# Patient Record
Sex: Female | Born: 1985
Health system: Southern US, Community
[De-identification: ages and names within clinical notes are randomized; demographics above are authoritative.]

## PROBLEM LIST (undated history)

## (undated) DIAGNOSIS — A6920 Lyme disease, unspecified: Secondary | ICD-10-CM

## (undated) DIAGNOSIS — D649 Anemia, unspecified: Secondary | ICD-10-CM

## (undated) DIAGNOSIS — M329 Systemic lupus erythematosus, unspecified: Secondary | ICD-10-CM

## (undated) DIAGNOSIS — M069 Rheumatoid arthritis, unspecified: Secondary | ICD-10-CM

## (undated) HISTORY — PX: TUBAL LIGATION: SHX77

---

## 2011-10-20 ENCOUNTER — Encounter (HOSPITAL_BASED_OUTPATIENT_CLINIC_OR_DEPARTMENT_OTHER): Payer: Self-pay | Admitting: *Deleted

## 2011-10-20 ENCOUNTER — Emergency Department (INDEPENDENT_AMBULATORY_CARE_PROVIDER_SITE_OTHER): Payer: Self-pay

## 2011-10-20 ENCOUNTER — Emergency Department (HOSPITAL_BASED_OUTPATIENT_CLINIC_OR_DEPARTMENT_OTHER)
Admission: EM | Admit: 2011-10-20 | Discharge: 2011-10-20 | Disposition: A | Discharge status: Home / Self Care | Payer: Self-pay | Attending: Emergency Medicine | Admitting: Emergency Medicine

## 2011-10-20 ENCOUNTER — Other Ambulatory Visit: Payer: Self-pay

## 2011-10-20 DIAGNOSIS — F17200 Nicotine dependence, unspecified, uncomplicated: Secondary | ICD-10-CM | POA: Insufficient documentation

## 2011-10-20 DIAGNOSIS — R799 Abnormal finding of blood chemistry, unspecified: Secondary | ICD-10-CM

## 2011-10-20 DIAGNOSIS — R079 Chest pain, unspecified: Secondary | ICD-10-CM

## 2011-10-20 DIAGNOSIS — R071 Chest pain on breathing: Secondary | ICD-10-CM

## 2011-10-20 LAB — D-DIMER, QUANTITATIVE: D-Dimer, Quant: 0.55 ug/mL-FEU — ABNORMAL HIGH (ref 0.00–0.48)

## 2011-10-20 MED ORDER — IOHEXOL 350 MG/ML SOLN
100.0000 mL | Freq: Once | INTRAVENOUS | Status: AC | PRN
  Administered 2011-10-20: 100 mL via INTRAVENOUS

## 2011-10-20 MED ORDER — SODIUM CHLORIDE 0.9 % IV BOLUS (SEPSIS)
1000.0000 mL | Freq: Once | INTRAVENOUS | Status: AC
  Administered 2011-10-20: 1000 mL via INTRAVENOUS

## 2011-10-20 MED ORDER — DIAZEPAM 5 MG/ML IJ SOLN
5.0000 mg | Freq: Once | INTRAMUSCULAR | Status: AC
  Administered 2011-10-20: 5 mg via INTRAVENOUS
  Filled 2011-10-20: qty 2

## 2011-10-20 NOTE — ED Provider Notes (Signed)
History    26 year old female with chest pain.gradual onset about a week and half ago. Pain is substernal. Worse with deep inspiration. Describes the pain as sharp. Was seen at outside facility couple days ago and diagnosed with reflux. Prescribed a medication for this which she has been taking without relief. Not sure of what med. Denies trauma. No SOB. No fever or chills. No hx of similar pain. Is a smoker. Denies hx of blood clot. No unusual leg pain or swelling.   CSN: [id number]  Arrival date & time 10/20/11  1427   First MD Initiated Contact with Patient 10/20/11 1452      Chief Complaint  Patient presents with  . Chest Pain    (Consider location/radiation/quality/duration/timing/severity/associated sxs/prior treatment) HPI  History reviewed. No pertinent past medical history.  Past Surgical History  Procedure Date  . Cesarean section   . Tubal ligation     History reviewed. No pertinent family history.  History  Substance Use Topics  . Smoking status: Current Everyday Smoker  . Smokeless tobacco: Not on file  . Alcohol Use: Yes    OB History    Grav Para Term Preterm Abortions TAB SAB Ect Mult Living                  Review of Systems   Review of symptoms negative unless otherwise noted in HPI.   Allergies  Penicillins  Home Medications  No current outpatient prescriptions on file.  BP 103/54  Pulse 92  Temp(Src) 99.1 F (37.3 C) (Oral)  Resp 20  Ht 5\' 3"  (1.6 m)  Wt 120 lb (54.432 kg)  BMI 21.26 kg/m2  SpO2 100%  LMP 10/20/2011  Physical Exam  Nursing note and vitals reviewed. Constitutional: She appears well-developed and well-nourished. No distress.       Sitting up in bed. NAD.  HENT:  Head: Normocephalic and atraumatic.  Eyes: Conjunctivae are normal. Pupils are equal, round, and reactive to light. Right eye exhibits no discharge. Left eye exhibits no discharge.  Neck: Neck supple.  Cardiovascular: Normal rate, regular rhythm and  normal heart sounds.  Exam reveals no gallop and no friction rub.   No murmur heard. Pulmonary/Chest: Effort normal and breath sounds normal. No respiratory distress. She exhibits no tenderness.       CP is not reproducible.  Abdominal: Soft. She exhibits no distension. There is no tenderness.  Musculoskeletal: She exhibits no edema and no tenderness.       No calf tenderness. Negative homan's.  Neurological: She is alert.  Skin: Skin is warm and dry. She is not diaphoretic.  Psychiatric: She has a normal mood and affect. Her behavior is normal. Thought content normal.    ED Course  Procedures (including critical care time)   Labs Reviewed  D-DIMER, QUANTITATIVE   Dg Chest 2 View  10/20/2011  *RADIOLOGY REPORT*  Clinical Data: Chest pain  CHEST - 2 VIEW  Comparison: None.  Findings: Cardiomediastinal silhouette is within normal limits. The lungs are clear. No pleural effusion.  No pneumothorax.  No acute osseous abnormality.  IMPRESSION: Normal chest.  Original Report Authenticated By: Harrel Lemon, M.D.   Ct Angio Chest W/cm &/or Wo Cm  10/20/2011  *RADIOLOGY REPORT*  Clinical Data: Pleuritic chest pain.  Elevated D-dimer.  CT ANGIOGRAPHY CHEST  Technique:  Multidetector CT imaging of the chest using the standard protocol during bolus administration of intravenous contrast. Multiplanar reconstructed images including MIPs were obtained and reviewed to evaluate the  vascular anatomy.  Contrast: OMNIPAQUE IOHEXOL 350 MG/ML IV SOLN  Comparison: None.  Findings: Satisfactory opacification of the pulmonary arteries noted, and there is no evidence of pulmonary emboli.  No evidence of thoracic aortic aneurysm or dissection.  No evidence of mediastinal hematoma or mass.  No lymphadenopathy identified within the thorax.  There is no evidence of pleural or pericardial effusion.  Both lungs are clear.  No evidence of pulmonary infiltrate or mass.  No central endobronchial lesion identified.   IMPRESSION: Negative.  No evidence of pulmonary embolism or other active disease.  Original Report Authenticated By: Danae Orleans, M.D.    EKG:  Rhythm: normal sinus Rate: 91 Axis: normal Intervals: normal ST segments:minimal non-diagnostic ST elevation in v2. No comparison.   1. Chest pain       MDM  25yF with CP. Etiology unclear but low suspicion for emergent/urgent etiology. Consider infectious but doubt. CXR and CT with no focal infiltrate. Afebrile and no respiratory distress on exam. Felt clinically low probability for PE but dimer elevated. Subsequent CT neg for PE or other acute pathology. EKG with nonspecific changes. Consider pericarditis but doubt. Possibly GERD but not classic symptoms. Feel reasonable to continue prescribed meds for this though. Possibly musculoskeletal but not reproducible. Feel that pt is safe for DC at this time. Discussed smoking cessation. Outpt fu.        Raeford Razor, MD 10/20/11 [phone]

## 2011-10-20 NOTE — ED Notes (Signed)
Pt states she has been having chest tightness for 1-1/2 weeks. Increased with walking and deep inspiration. Went to [facility] last week and was dx'd with acid reflux and given med for same. Pt feels there is something more going on and wants further testing.

## 2012-01-10 ENCOUNTER — Emergency Department (HOSPITAL_BASED_OUTPATIENT_CLINIC_OR_DEPARTMENT_OTHER)
Admission: EM | Admit: 2012-01-10 | Discharge: 2012-01-10 | Disposition: A | Discharge status: Home / Self Care | Payer: Self-pay | Attending: Emergency Medicine | Admitting: Emergency Medicine

## 2012-01-10 ENCOUNTER — Encounter (HOSPITAL_BASED_OUTPATIENT_CLINIC_OR_DEPARTMENT_OTHER): Payer: Self-pay | Admitting: Emergency Medicine

## 2012-01-10 DIAGNOSIS — L0291 Cutaneous abscess, unspecified: Secondary | ICD-10-CM

## 2012-01-10 DIAGNOSIS — N764 Abscess of vulva: Secondary | ICD-10-CM | POA: Insufficient documentation

## 2012-01-10 MED ORDER — SULFAMETHOXAZOLE-TRIMETHOPRIM 800-160 MG PO TABS: 1.0000 | ORAL_TABLET | Freq: Two times a day (BID) | ORAL | Status: AC

## 2012-01-10 NOTE — ED Provider Notes (Signed)
History     CSN: [id number]  Arrival date & time 01/10/12  1211   First MD Initiated Contact with Patient 01/10/12 1236      Chief Complaint  Patient presents with  . Cyst    (Consider location/radiation/quality/duration/timing/severity/associated sxs/prior treatment) HPI Comments: Pt states that she has felt a small area to the right labia:pt has a history of similar symptoms  Patient is a 26 y.o. female presenting with abscess. The history is provided by the patient. No language interpreter was used.  Abscess  This is a recurrent problem. The current episode started yesterday. The problem occurs continuously. The problem has been unchanged. Affected Location: right labia. The problem is mild. The abscess is characterized by painfulness. It is unknown what she was exposed to. Pertinent negatives include no fever, no diarrhea and no vomiting. Her past medical history is significant for skin abscesses in family. There were no sick contacts. She has received no recent medical care.    History reviewed. No pertinent past medical history.  Past Surgical History  Procedure Date  . Cesarean section   . Tubal ligation     History reviewed. No pertinent family history.  History  Substance Use Topics  . Smoking status: Current Everyday Smoker  . Smokeless tobacco: Not on file  . Alcohol Use: Yes    OB History    Grav Para Term Preterm Abortions TAB SAB Ect Mult Living                  Review of Systems  Constitutional: Negative for fever.  Eyes: Negative.   Respiratory: Negative.   Cardiovascular: Negative.   Gastrointestinal: Negative for vomiting and diarrhea.  Skin: Positive for wound.  Neurological: Negative.     Allergies  Penicillins and Bactroban  Home Medications  No current outpatient prescriptions on file.  BP 100/50  Pulse 77  Temp(Src) 98.3 F (36.8 C) (Oral)  Resp 14  Ht 5\' 3"  (1.6 m)  Wt 133 lb (60.328 kg)  BMI 23.56 kg/m2  SpO2 100%  LMP  12/20/2011  Physical Exam  Nursing note and vitals reviewed. Constitutional: She is oriented to person, place, and time. She appears well-developed and well-nourished.  Cardiovascular: Normal rate and regular rhythm.   Pulmonary/Chest: Effort normal and breath sounds normal.  Genitourinary:     Musculoskeletal: Normal range of motion.  Neurological: She is alert and oriented to person, place, and time.    ED Course  Procedures (including critical care time)  Labs Reviewed - No data to display No results found.   1. Abscess       MDM  No I&D Needed at this time        Teressa Lower, NP 01/10/12 1303

## 2012-01-10 NOTE — Discharge Instructions (Signed)

## 2012-01-10 NOTE — ED Notes (Signed)
The I & D tray is set up and at the bed side.

## 2012-01-10 NOTE — ED Notes (Signed)
Pt states she has possible boil on right lower labia.  No drainage at present.  No known fever.

## 2012-01-13 NOTE — ED Provider Notes (Signed)
Medical screening examination/treatment/procedure(s) were performed by non-physician practitioner and as supervising physician I was immediately available for consultation/collaboration.   Rolan Bucco, MD 01/13/12 1500

## 2012-01-30 ENCOUNTER — Emergency Department (HOSPITAL_BASED_OUTPATIENT_CLINIC_OR_DEPARTMENT_OTHER)
Admission: EM | Admit: 2012-01-30 | Discharge: 2012-01-30 | Disposition: A | Discharge status: Home / Self Care | Payer: Self-pay | Attending: Emergency Medicine | Admitting: Emergency Medicine

## 2012-01-30 ENCOUNTER — Encounter (HOSPITAL_BASED_OUTPATIENT_CLINIC_OR_DEPARTMENT_OTHER): Payer: Self-pay | Admitting: Emergency Medicine

## 2012-01-30 DIAGNOSIS — F17200 Nicotine dependence, unspecified, uncomplicated: Secondary | ICD-10-CM | POA: Insufficient documentation

## 2012-01-30 DIAGNOSIS — M79609 Pain in unspecified limb: Secondary | ICD-10-CM | POA: Insufficient documentation

## 2012-01-30 DIAGNOSIS — L0291 Cutaneous abscess, unspecified: Secondary | ICD-10-CM

## 2012-01-30 DIAGNOSIS — L03119 Cellulitis of unspecified part of limb: Secondary | ICD-10-CM | POA: Insufficient documentation

## 2012-01-30 DIAGNOSIS — L02419 Cutaneous abscess of limb, unspecified: Secondary | ICD-10-CM | POA: Insufficient documentation

## 2012-01-30 MED ORDER — CLINDAMYCIN HCL 150 MG PO CAPS: 150.0000 mg | ORAL_CAPSULE | Freq: Four times a day (QID) | ORAL | Status: AC

## 2012-01-30 NOTE — ED Notes (Signed)
Pt c/o "boil" to LT posterior thigh since yesterday

## 2012-01-30 NOTE — ED Provider Notes (Addendum)
History     CSN: [id number]  Arrival date & time 01/30/12  1035   First MD Initiated Contact with Patient 01/30/12 1112      Chief Complaint  Patient presents with  . Skin Problem    (Consider location/radiation/quality/duration/timing/severity/associated sxs/prior treatment) Patient is a 26 y.o. female presenting with abscess. The history is provided by the patient.  Abscess  This is a new problem. The current episode started yesterday. The problem occurs continuously. The problem has been gradually worsening. The abscess is present on the left upper leg. The problem is mild. The abscess is characterized by redness and painfulness. It is unknown what she was exposed to. The abscess first occurred at home. Pertinent negatives include no fever and no cough. Her past medical history is significant for skin abscesses in family. There were no sick contacts. She has received no recent medical care.    History reviewed. No pertinent past medical history.  Past Surgical History  Procedure Date  . Cesarean section   . Tubal ligation     No family history on file.  History  Substance Use Topics  . Smoking status: Current Everyday Smoker  . Smokeless tobacco: Not on file  . Alcohol Use: Yes    OB History    Grav Para Term Preterm Abortions TAB SAB Ect Mult Living                  Review of Systems  Constitutional: Negative for fever.  Respiratory: Negative for cough.   All other systems reviewed and are negative.    Allergies  Penicillins and Bactroban  Home Medications  No current outpatient prescriptions on file.  BP 108/49  Pulse 88  Temp(Src) 98.4 F (36.9 C) (Oral)  Resp 16  SpO2 100%  LMP 11/17/2011  Physical Exam  Nursing note and vitals reviewed. Constitutional: She is oriented to person, place, and time. She appears well-developed and well-nourished. No distress.  HENT:  Head: Normocephalic and atraumatic.  Eyes: EOM are normal. Pupils are equal,  round, and reactive to light.  Musculoskeletal: She exhibits no edema and no tenderness.  Neurological: She is alert and oriented to person, place, and time.  Skin: Skin is warm and dry.     Psychiatric: She has a normal mood and affect. Her behavior is normal.    ED Course  Procedures (including critical care time)  Labs Reviewed - No data to display No results found.  INCISION AND DRAINAGE Performed by: Gwyneth Sprout Consent: Verbal consent obtained. Risks and benefits: risks, benefits and alternatives were discussed Type: abscess  Body area: left posterior thigh   Anesthesia: local infiltration  Local anesthetic: none  Anesthetic total: 0 ml  Complexity: needle insertion Drainage: purulent  Drainage amount: small  Packing material: none Patient tolerance: Patient tolerated the procedure well with no immediate complications.     1. Abscess       MDM   Small abscess on the left posterior thigh. Needle insertion with a small amount of pus drainage but no induration and some mild cellulitis. Patient prescribed clindamycin and to continue soaks.       Gwyneth Sprout, MD 01/30/12 1138  Gwyneth Sprout, MD 01/30/12 1139

## 2012-01-30 NOTE — Discharge Instructions (Signed)

## 2012-02-01 ENCOUNTER — Emergency Department (HOSPITAL_BASED_OUTPATIENT_CLINIC_OR_DEPARTMENT_OTHER)
Admission: EM | Admit: 2012-02-01 | Discharge: 2012-02-01 | Discharge status: Against Medical Advice | Payer: Self-pay | Attending: Emergency Medicine | Admitting: Emergency Medicine

## 2012-02-01 ENCOUNTER — Encounter (HOSPITAL_BASED_OUTPATIENT_CLINIC_OR_DEPARTMENT_OTHER): Payer: Self-pay | Admitting: *Deleted

## 2012-02-01 DIAGNOSIS — L0291 Cutaneous abscess, unspecified: Secondary | ICD-10-CM

## 2012-02-01 DIAGNOSIS — F17200 Nicotine dependence, unspecified, uncomplicated: Secondary | ICD-10-CM | POA: Insufficient documentation

## 2012-02-01 DIAGNOSIS — L02419 Cutaneous abscess of limb, unspecified: Secondary | ICD-10-CM | POA: Insufficient documentation

## 2012-02-01 DIAGNOSIS — Z79899 Other long term (current) drug therapy: Secondary | ICD-10-CM | POA: Insufficient documentation

## 2012-02-01 NOTE — ED Notes (Signed)
Pt seen here Wed for drainage of wound on back of left thigh. States increased swelling.

## 2012-02-01 NOTE — ED Notes (Signed)
Pt approached the Rn station upset over wait states that she had three children that need to eat and she has been here over 3 hours( at that point 2.5 hours) I asked pt to please return to room due to pt privacy and that I would talk to NP pt states that she rather not again explained that pt could not stand at RN station pt states that she wants her RX and then she will go. Talked to NP/ who explained that pt refused the appropriate treatment and requested abx instead. Pt returned to room and became verbally abusive cursing loudly apologized to pt about wait and told her I would again talk to NP. When I left I  closed door to room pt again began to verbally abuse myself, telling me to leave the door open I explained that I could not leave it open all the way because of the exchange of pt information pt stated that I was violating her pt rights and again cursed at me. I explained she could leave if she wished but needed to sign AMA form since she was refusing the appropriate tx. Pt again began cursing and security was called to escort pt out. Pt refused to leave stating she was not leaving until she got her RX explained to pt that if she refused to leave we would call [employer] to escort her off the property. Pt chose to leave escorted by  security.

## 2012-02-02 NOTE — ED Notes (Signed)
Pt called to request Clindamycin liquid instead of capsules; Spoke with Trey Paula at [facility] to change rx to liquid equivalent per v.o. Dr. Berlinda Last

## 2012-02-02 NOTE — ED Provider Notes (Signed)
History     CSN: [id number]  Arrival date & time 02/01/12  1826   First MD Initiated Contact with Patient 02/01/12 2007      Chief Complaint  Patient presents with  . Wound Check    (Consider location/radiation/quality/duration/timing/severity/associated sxs/prior treatment) Patient is a 26 y.o. female presenting with wound check. The history is provided by the patient. No language interpreter was used.  Wound Check  She was treated in the ED 2 to 3 days ago. There has been no treatment since the wound repair. There has been clear discharge from the wound. The redness has not changed. The swelling has not changed.  Patient reports increased swelling to an abscess that was drained with a needle on 5/16 to posterior thigh.  She was also given an rx for clindamycin that she did not take because she can not swallow pills.  I explained that the abscess needed to be opened and drained but she refused to be "cut".  i offered to do a needle drainage as Dr. Anitra Lauth did 2 days ago and give her liquid antibiotics.  4 kids at bedside. Abscess 4cm in size.    History reviewed. No pertinent past medical history.  Past Surgical History  Procedure Date  . Cesarean section   . Tubal ligation     History reviewed. No pertinent family history.  History  Substance Use Topics  . Smoking status: Current Everyday Smoker  . Smokeless tobacco: Not on file  . Alcohol Use: Yes    OB History    Grav Para Term Preterm Abortions TAB SAB Ect Mult Living                  Review of Systems  Constitutional: Negative.  Negative for fever and diaphoresis.  HENT: Negative.   Eyes: Negative.   Respiratory: Negative.  Negative for shortness of breath.   Cardiovascular: Negative.   Gastrointestinal: Negative.  Negative for nausea and vomiting.  Skin:       4cm abscess to posterior R thigh  Neurological: Negative.  Negative for dizziness, weakness and light-headedness.  Psychiatric/Behavioral: Negative.    All other systems reviewed and are negative.    Allergies  Penicillins and Bactroban  Home Medications   Current Outpatient Rx  Name Route Sig Dispense Refill  . BC HEADACHE PO Oral Take 1 packet by mouth once as needed. For headache    . CLINDAMYCIN HCL 150 MG PO CAPS Oral Take 1 capsule (150 mg total) by mouth every 6 (six) hours. 28 capsule 0    BP 95/40  Pulse 81  Temp(Src) 98.9 F (37.2 C) (Oral)  Resp 17  Ht 5\' 3"  (1.6 m)  Wt 130 lb (58.968 kg)  BMI 23.03 kg/m2  SpO2 100%  LMP 01/17/2012  Physical Exam  Nursing note and vitals reviewed. Constitutional: She is oriented to person, place, and time. She appears well-developed and well-nourished. No distress.  HENT:  Head: Normocephalic and atraumatic.  Eyes: Conjunctivae and EOM are normal. Pupils are equal, round, and reactive to light.  Neck: Normal range of motion. Neck supple.  Cardiovascular: Normal rate.   Pulmonary/Chest: Effort normal.  Abdominal: Soft.  Musculoskeletal: Normal range of motion. She exhibits no edema and no tenderness.  Neurological: She is alert and oriented to person, place, and time. She has normal reflexes.  Skin: Skin is warm and dry.       Abscess to poster L thigh  Psychiatric: She has a normal mood and affect.  Beligerent and angry mood verbally abusive    ED Course  Procedures (including critical care time)  Labs Reviewed - No data to display No results found.   No diagnosis found.    MDM  Left ama after assessment.  Angry for waiting too long.        Remi Haggard, NP 02/02/12 [phone]

## 2012-02-03 NOTE — ED Provider Notes (Signed)
Medical screening examination/treatment/procedure(s) were performed by non-physician practitioner and as supervising physician I was immediately available for consultation/collaboration.   Suzi Roots, MD 02/03/12 [phone]

## 2013-04-12 ENCOUNTER — Encounter (HOSPITAL_BASED_OUTPATIENT_CLINIC_OR_DEPARTMENT_OTHER): Payer: Self-pay

## 2013-04-12 ENCOUNTER — Emergency Department (HOSPITAL_BASED_OUTPATIENT_CLINIC_OR_DEPARTMENT_OTHER)
Admission: EM | Admit: 2013-04-12 | Discharge: 2013-04-12 | Disposition: A | Discharge status: Home / Self Care | Payer: Medicaid Other | Attending: Emergency Medicine | Admitting: Emergency Medicine

## 2013-04-12 DIAGNOSIS — L259 Unspecified contact dermatitis, unspecified cause: Secondary | ICD-10-CM | POA: Insufficient documentation

## 2013-04-12 DIAGNOSIS — Z880 Allergy status to penicillin: Secondary | ICD-10-CM | POA: Insufficient documentation

## 2013-04-12 DIAGNOSIS — F17200 Nicotine dependence, unspecified, uncomplicated: Secondary | ICD-10-CM | POA: Insufficient documentation

## 2013-04-12 MED ORDER — HYDROCORTISONE 2.5 % EX LOTN: TOPICAL_LOTION | Freq: Two times a day (BID) | CUTANEOUS | Status: DC

## 2013-04-12 NOTE — ED Provider Notes (Signed)
Medical screening examination/treatment/procedure(s) were performed by non-physician practitioner and as supervising physician I was immediately available for consultation/collaboration.  Doug Sou, MD 04/12/13 1221

## 2013-04-12 NOTE — ED Notes (Signed)
C/o hives to left buttock x 3 days-denies itching-reports area is painful to touch

## 2013-04-12 NOTE — ED Provider Notes (Signed)
CSN: [id number]     Arrival date & time 04/12/13  1115 History     First MD Initiated Contact with Patient 04/12/13 1116     Chief Complaint  Patient presents with  . Rash   (Consider location/radiation/quality/duration/timing/severity/associated sxs/prior Treatment) HPI Pt is a 27yo female presenting with a rash on her left buttock that started on Friday 7/25.  Rash is minimally painful to touch, 2/10, worse when sitting.  Rash consists of tiny red bumps on lateral aspect of left buttock.  Reports taking "bleach baths" over the past 2 weeks to prevent boils, however rash developed after pt started baths.  Pt also reports new lotion and body fragrance, however denies any other rash on her body. Denies itching to area.  Denies genital lesions, dysuria, vaginal pain or discharge. Denies abdominal pain, n/v/d, fever.       History reviewed. No pertinent past medical history. Past Surgical History  Procedure Laterality Date  . Cesarean section    . Tubal ligation     No family history on file. History  Substance Use Topics  . Smoking status: Current Every Day Smoker  . Smokeless tobacco: Not on file  . Alcohol Use: Yes   OB History   Grav Para Term Preterm Abortions TAB SAB Ect Mult Living                 Review of Systems  Constitutional: Negative for fever and chills.  Gastrointestinal: Negative for nausea, vomiting, abdominal pain and diarrhea.  Genitourinary: Negative for dysuria, urgency, frequency, hematuria, flank pain, vaginal discharge, vaginal pain, menstrual problem and pelvic pain.  Skin: Positive for rash.  All other systems reviewed and are negative.    Allergies  Penicillins and Bactroban  Home Medications   Current Outpatient Rx  Name  Route  Sig  Dispense  Refill  . Aspirin-Salicylamide-Caffeine (BC HEADACHE PO)   Oral   Take 1 packet by mouth once as needed. For headache         . hydrocortisone 2.5 % lotion   Topical   Apply topically 2 (two)  times daily.   59 mL   0    BP 102/51  Pulse 84  Temp(Src) 98.1 F (36.7 C) (Oral)  Resp 16  Ht 5\' 3"  (1.6 m)  Wt 135 lb (61.236 kg)  BMI 23.92 kg/m2  SpO2 100%  LMP 03/29/2013 Physical Exam  Nursing note and vitals reviewed. Constitutional: She appears well-developed and well-nourished. No distress.  HENT:  Head: Normocephalic and atraumatic.  Eyes: Conjunctivae are normal. No scleral icterus.  Neck: Normal range of motion.  Cardiovascular: Normal rate, regular rhythm and normal heart sounds.   Pulmonary/Chest: Effort normal and breath sounds normal. No respiratory distress. She has no wheezes. She has no rales. She exhibits no tenderness.  Abdominal: Soft. Bowel sounds are normal. She exhibits no distension and no mass. There is no tenderness. There is no rebound and no guarding.  Musculoskeletal: Normal range of motion.  Neurological: She is alert.  Skin: Skin is warm and dry. Rash noted. She is not diaphoretic.       ED Course   Procedures (including critical care time)  Labs Reviewed - No data to display No results found. 1. Contact dermatitis     MDM  Rash appears to be contact dermatitis.  Not concerned for herpes simplex or herpes zoster at this time.  Pt may use prescription hydrocortisone or OTC hydrocortizone cream 2-3x daily over affected area.  Return  precautions provided.  Advised to have area rechecked in 1 week if not improving, sooner if worsening. Pt verbalized understanding and agreement with tx plan.     Junius Finner, PA-C 04/12/13 1154

## 2013-04-12 NOTE — ED Notes (Signed)
ED PA at BS 

## 2014-03-22 ENCOUNTER — Emergency Department (HOSPITAL_BASED_OUTPATIENT_CLINIC_OR_DEPARTMENT_OTHER)
Admission: EM | Admit: 2014-03-22 | Discharge: 2014-03-22 | Disposition: A | Discharge status: Home / Self Care | Payer: Medicaid Other | Attending: Emergency Medicine | Admitting: Emergency Medicine

## 2014-03-22 ENCOUNTER — Encounter (HOSPITAL_BASED_OUTPATIENT_CLINIC_OR_DEPARTMENT_OTHER): Payer: Self-pay | Admitting: Emergency Medicine

## 2014-03-22 DIAGNOSIS — Z880 Allergy status to penicillin: Secondary | ICD-10-CM | POA: Insufficient documentation

## 2014-03-22 DIAGNOSIS — R1909 Other intra-abdominal and pelvic swelling, mass and lump: Secondary | ICD-10-CM | POA: Insufficient documentation

## 2014-03-22 DIAGNOSIS — F17200 Nicotine dependence, unspecified, uncomplicated: Secondary | ICD-10-CM | POA: Diagnosis not present

## 2014-03-22 MED ORDER — DOXYCYCLINE HYCLATE 100 MG PO CAPS: 100.0000 mg | ORAL_CAPSULE | Freq: Two times a day (BID) | ORAL | Status: DC

## 2014-03-22 NOTE — ED Provider Notes (Signed)
CSN: [id number]     Arrival date & time 03/22/14  1826 History   First MD Initiated Contact with Patient 03/22/14 1838     Chief Complaint  Patient presents with  . Abscess     (Consider location/radiation/quality/duration/timing/severity/associated sxs/prior Treatment) Patient is a 28 y.o. female presenting with abscess. The history is provided by the patient.  Abscess Location:  Ano-genital Ano-genital abscess location:  Vulva Abscess quality: painful   Red streaking: no   Progression:  Worsening Pain details:    Quality:  No pain   Severity:  No pain Chronicity:  New Relieved by:  Nothing Ineffective treatments:  None tried Pt reports normal std check 2 weeks ago which were normal. Pt reports she feels a knot on right side   History reviewed. No pertinent past medical history. Past Surgical History  Procedure Laterality Date  . Cesarean section    . Tubal ligation     No family history on file. History  Substance Use Topics  . Smoking status: Current Every Day Smoker  . Smokeless tobacco: Not on file  . Alcohol Use: Yes   OB History   Grav Para Term Preterm Abortions TAB SAB Ect Mult Living                 Review of Systems  Genitourinary: Negative for genital sores.  All other systems reviewed and are negative.     Allergies  Penicillins and Bactroban  Home Medications   Prior to Admission medications   Not on File   BP 111/52  Pulse 102  Temp(Src) 98 F (36.7 C) (Oral)  Resp 16  Ht 5\' 3"  (1.6 m)  Wt 126 lb (57.153 kg)  BMI 22.33 kg/m2  SpO2 100%  LMP 03/13/2014 Physical Exam  Nursing note and vitals reviewed. Constitutional: She is oriented to person, place, and time. She appears well-developed and well-nourished.  HENT:  Head: Normocephalic and atraumatic.  Eyes: EOM are normal.  Neck: Normal range of motion.  Cardiovascular: Normal rate.   Pulmonary/Chest: Effort normal.  Abdominal: She exhibits no distension.  Genitourinary:  52mm  size nodule right inner labia,  (feels like a lymph node,  No fluctuance)  Musculoskeletal: Normal range of motion.  Neurological: She is alert and oriented to person, place, and time.  Skin: Skin is warm.  Psychiatric: She has a normal mood and affect.    ED Course  Procedures (including critical care time) Labs Review Labs Reviewed - No data to display  Imaging Review No results found.   EKG Interpretation None      MDM   Final diagnoses:  Lump in the groin    Doxycycline [facility] if [facility]    1m, PA-C 03/22/14 1916

## 2014-03-22 NOTE — ED Notes (Signed)
Reports "bump" to vagina. Sts she has been tested recently for STDs and everything has come back normal.

## 2014-03-23 NOTE — ED Provider Notes (Signed)
Medical screening examination/treatment/procedure(s) were performed by non-physician practitioner and as supervising physician I was immediately available for consultation/collaboration.   EKG Interpretation None        Courtney F Horton, MD 03/23/14 0032 

## 2014-04-05 ENCOUNTER — Encounter (HOSPITAL_BASED_OUTPATIENT_CLINIC_OR_DEPARTMENT_OTHER): Payer: Self-pay | Admitting: Emergency Medicine

## 2014-04-05 ENCOUNTER — Emergency Department (HOSPITAL_BASED_OUTPATIENT_CLINIC_OR_DEPARTMENT_OTHER)
Admission: EM | Admit: 2014-04-05 | Discharge: 2014-04-05 | Disposition: A | Discharge status: Home / Self Care | Payer: Medicaid Other | Attending: Emergency Medicine | Admitting: Emergency Medicine

## 2014-04-05 DIAGNOSIS — D509 Iron deficiency anemia, unspecified: Secondary | ICD-10-CM | POA: Diagnosis not present

## 2014-04-05 DIAGNOSIS — Z3202 Encounter for pregnancy test, result negative: Secondary | ICD-10-CM | POA: Diagnosis not present

## 2014-04-05 DIAGNOSIS — Z880 Allergy status to penicillin: Secondary | ICD-10-CM | POA: Diagnosis not present

## 2014-04-05 DIAGNOSIS — F17200 Nicotine dependence, unspecified, uncomplicated: Secondary | ICD-10-CM | POA: Insufficient documentation

## 2014-04-05 DIAGNOSIS — R42 Dizziness and giddiness: Secondary | ICD-10-CM | POA: Diagnosis present

## 2014-04-05 LAB — URINE MICROSCOPIC-ADD ON

## 2014-04-05 LAB — BASIC METABOLIC PANEL
ANION GAP: 13 (ref 5–15)
BUN: 9 mg/dL (ref 6–23)
CHLORIDE: 106 meq/L (ref 96–112)
CO2: 20 meq/L (ref 19–32)
CREATININE: 0.6 mg/dL (ref 0.50–1.10)
Calcium: 9.3 mg/dL (ref 8.4–10.5)
GFR calc Af Amer: >90 mL/min (ref >90)
GFR calc non Af Amer: >90 mL/min (ref >90)
Glucose, Bld: 83 mg/dL (ref 70–99)
POTASSIUM: 3.5 meq/L — AB (ref 3.7–5.3)
Sodium: 139 mEq/L (ref 137–147)

## 2014-04-05 LAB — CBC WITH DIFFERENTIAL/PLATELET
BASOS PCT: 0 % (ref 0–1)
Basophils Absolute: 0 10*3/uL (ref 0.0–0.1)
EOS PCT: 1 % (ref 0–5)
Eosinophils Absolute: 0.1 10*3/uL (ref 0.0–0.7)
HCT: 20.9 % — ABNORMAL LOW (ref 36.0–46.0)
HEMOGLOBIN: 5.3 g/dL — AB (ref 12.0–15.0)
LYMPHS ABS: 0.9 10*3/uL (ref 0.7–4.0)
Lymphocytes Relative: 6 % — ABNORMAL LOW (ref 12–46)
MCH: 14.6 pg — AB (ref 26.0–34.0)
MCHC: 25.4 g/dL — ABNORMAL LOW (ref 30.0–36.0)
MCV: 57.6 fL — AB (ref 78.0–100.0)
MONO ABS: 1.2 10*3/uL — AB (ref 0.1–1.0)
MONOS PCT: 8 % (ref 3–12)
NEUTROS PCT: 85 % — AB (ref 43–77)
Neutro Abs: 12.6 10*3/uL — ABNORMAL HIGH (ref 1.7–7.7)
Platelets: 321 10*3/uL (ref 150–400)
RBC: 3.63 MIL/uL — AB (ref 3.87–5.11)
RDW: 21.9 % — ABNORMAL HIGH (ref 11.5–15.5)
WBC: 14.8 10*3/uL — ABNORMAL HIGH (ref 4.0–10.5)

## 2014-04-05 LAB — URINALYSIS, ROUTINE W REFLEX MICROSCOPIC
GLUCOSE, UA: NEGATIVE mg/dL
HGB URINE DIPSTICK: NEGATIVE
Ketones, ur: 15 mg/dL — AB
Nitrite: NEGATIVE
PH: 5 (ref 5.0–8.0)
Protein, ur: 30 mg/dL — AB
SPECIFIC GRAVITY, URINE: 1.027 (ref 1.005–1.030)
Urobilinogen, UA: 1 mg/dL (ref 0.0–1.0)

## 2014-04-05 LAB — PREGNANCY, URINE: Preg Test, Ur: NEGATIVE

## 2014-04-05 MED ORDER — SODIUM CHLORIDE 0.9 % IV BOLUS (SEPSIS)
1000.0000 mL | Freq: Once | INTRAVENOUS | Status: AC
  Administered 2014-04-05: 1000 mL via INTRAVENOUS

## 2014-04-05 MED ORDER — FERROUS SULFATE 325 (65 FE) MG PO TABS: 325.0000 mg | ORAL_TABLET | Freq: Two times a day (BID) | ORAL | Status: DC

## 2014-04-05 NOTE — ED Notes (Signed)
Pt went to work this am, had sudden onset of dizziness and feeling flushed.  Last po intake was last night.  No N/V/D recently.

## 2014-04-05 NOTE — ED Notes (Signed)
EDP informed of VS prior to d/c.

## 2014-04-05 NOTE — ED Provider Notes (Signed)
CSN: [id number]     Arrival date & time 04/05/14  7564 History   First MD Initiated Contact with Patient 04/05/14 [phone]     Chief Complaint  Patient presents with  . Dizziness     (Consider location/radiation/quality/duration/timing/severity/associated sxs/prior Treatment) HPI Comments: Patient is a 28 year old female who presents with complaints of dizziness and lightheadedness. This started shortly after she arrived at work this morning. She denies she is having any headache, fevers, nausea, vomiting, or diarrhea. She denies any other specific complaints. Her symptoms have improved somewhat since arriving here. Her last menstrual period was the beginning of last month. Patient states that she fell this way before when she was pregnant, however she reports having a tubal ligation 5 years ago.  Patient is a 28 y.o. female presenting with dizziness. The history is provided by the patient.  Dizziness Quality:  Lightheadedness Severity:  Moderate Onset quality:  Sudden Duration:  1 hour Timing:  Constant Progression:  Partially resolved Chronicity:  New Relieved by:  Nothing Worsened by:  Nothing tried Ineffective treatments:  None tried   No past medical history on file. Past Surgical History  Procedure Laterality Date  . Cesarean section    . Tubal ligation     No family history on file. History  Substance Use Topics  . Smoking status: Current Every Day Smoker  . Smokeless tobacco: Not on file  . Alcohol Use: Yes   OB History   Grav Para Term Preterm Abortions TAB SAB Ect Mult Living                 Review of Systems  Neurological: Positive for dizziness.  All other systems reviewed and are negative.     Allergies  Penicillins and Bactroban  Home Medications   Prior to Admission medications   Not on File   BP 103/50  Pulse 98  Temp(Src) 98.6 F (37 C) (Oral)  Resp 16  Ht 5\' 3"  (1.6 m)  Wt 126 lb (57.153 kg)  BMI 22.33 kg/m2  SpO2 100%  LMP  03/13/2014 Physical Exam  Nursing note and vitals reviewed. Constitutional: She is oriented to person, place, and time. She appears well-developed and well-nourished. No distress.  HENT:  Head: Normocephalic and atraumatic.  Mouth/Throat: Oropharynx is clear and moist.  Eyes: EOM are normal. Pupils are equal, round, and reactive to light.  Neck: Normal range of motion. Neck supple.  Cardiovascular: Normal rate and regular rhythm.  Exam reveals no gallop and no friction rub.   No murmur heard. Pulmonary/Chest: Effort normal and breath sounds normal. No respiratory distress. She has no wheezes.  Abdominal: Soft. Bowel sounds are normal. She exhibits no distension. There is no tenderness.  Musculoskeletal: Normal range of motion.  Neurological: She is alert and oriented to person, place, and time. No cranial nerve deficit. She exhibits normal muscle tone. Coordination normal.  Skin: Skin is warm and dry. She is not diaphoretic.    ED Course  Procedures (including critical care time) Labs Review Labs Reviewed  PREGNANCY, URINE  URINALYSIS, ROUTINE W REFLEX MICROSCOPIC  CBC WITH DIFFERENTIAL  BASIC METABOLIC PANEL    Imaging Review No results found.   EKG Interpretation None      MDM   Final diagnoses:  None    Patient is a 28 year old female with history of iron deficiency anemia. She presents with complaints of feeling dizzy this morning at work. Workup today reveals a hemoglobin of 5.3. Her blood pressures are somewhat low however  do not drop significantly with orthostatics. She is given 1 L of normal saline and tells me that she feels much better. I discussed the case with the OB/GYN on call who feels as though admission to internal medicine for transfusion is indicated. She appeared to be symptomatic with this, that was my impression as well. I discussed this with the patient who tells me she has been dealing with this for a long time. She tells me she's been told in the  past she should be transfused, however this has never been done. She does not want to be admitted and is refusing a transfusion. She understands that her hemoglobin is extremely low and that this could cause her additional problems, including serious disability and potentially death. I will prescribe her iron. She understands that if she begins to feel worse she should report to the emergency department to be reevaluated.   Geoffery Lyons, MD 04/05/14 1128

## 2014-04-05 NOTE — Discharge Instructions (Signed)
Iron as prescribed.  Return to the emergency department if he developed chest pain, difficulty breathing, fainting episodes, or any other new and concerning symptoms.   Iron Deficiency Anemia Anemia is a condition in which there are less red blood cells or hemoglobin in the blood than normal. Hemoglobin is the part of red blood cells that carries oxygen. Iron deficiency anemia is anemia caused by too little iron. It is the most common type of anemia. It may leave you tired and short of breath. CAUSES   Lack of iron in the diet.  Poor absorption of iron, as seen with intestinal disorders.  Intestinal bleeding.  Heavy periods. SIGNS AND SYMPTOMS  Mild anemia may not be noticeable. Symptoms may include:  Fatigue.  Headache.  Pale skin.  Weakness.  Tiredness.  Shortness of breath.  Dizziness.  Cold hands and feet.  Fast or irregular heartbeat. DIAGNOSIS  Diagnosis requires a thorough evaluation and physical exam by your health care provider. Blood tests are generally used to confirm iron deficiency anemia. Additional tests may be done to find the underlying cause of your anemia. These may include:  Testing for blood in the stool (fecal occult blood test).  A procedure to see inside the colon and rectum (colonoscopy).  A procedure to see inside the esophagus and stomach (endoscopy). TREATMENT  Iron deficiency anemia is treated by correcting the cause of the deficiency. Treatment may involve:  Adding iron-rich foods to your diet.  Taking iron supplements. Pregnant or breastfeeding women need to take extra iron because their normal diet usually does not provide the required amount.  Taking vitamins. Vitamin C improves the absorption of iron. Your health care provider may recommend that you take your iron tablets with a glass of orange juice or vitamin C supplement.  Medicines to make heavy menstrual flow lighter.  Surgery. HOME CARE INSTRUCTIONS   Take iron as  directed by your health care provider.  If you cannot tolerate taking iron supplements by mouth, talk to your health care provider about taking them through a vein (intravenously) or an injection into a muscle.  For the best iron absorption, iron supplements should be taken on an empty stomach. If you cannot tolerate them on an empty stomach, you may need to take them with food.  Do not drink milk or take antacids at the same time as your iron supplements. Milk and antacids may interfere with the absorption of iron.  Iron supplements can cause constipation. Make sure to include fiber in your diet to prevent constipation. A stool softener may also be recommended.  Take vitamins as directed by your health care provider.  Eat a diet rich in iron. Foods high in iron include liver, lean beef, whole-grain bread, eggs, dried fruit, and dark green leafy vegetables. SEEK IMMEDIATE MEDICAL CARE IF:   You faint. If this happens, do not drive. Call your local emergency services (911 in [place] if no other help is available.  You have chest pain.  You feel nauseous or vomit.  You have severe or increased shortness of breath with activity.  You feel weak.  You have a rapid heartbeat.  You have unexplained sweating.  You become light-headed when getting up from a chair or bed. MAKE SURE YOU:   Understand these instructions.  Will watch your condition.  Will get help right away if you are not doing well or get worse. Document Released: 08/30/2000 Document Revised: 09/07/2013 Document Reviewed: 05/10/2013 [facility] Patient Information 2015 [place], [place]. This information  is not intended to replace advice given to you by your health care provider. Make sure you discuss any questions you have with your health care provider.

## 2015-03-16 ENCOUNTER — Encounter (HOSPITAL_BASED_OUTPATIENT_CLINIC_OR_DEPARTMENT_OTHER): Payer: Self-pay | Admitting: *Deleted

## 2015-03-16 ENCOUNTER — Emergency Department (HOSPITAL_BASED_OUTPATIENT_CLINIC_OR_DEPARTMENT_OTHER)
Admission: EM | Admit: 2015-03-16 | Discharge: 2015-03-16 | Disposition: A | Discharge status: Home / Self Care | Payer: Medicaid Other | Attending: Emergency Medicine | Admitting: Emergency Medicine

## 2015-03-16 DIAGNOSIS — Z79899 Other long term (current) drug therapy: Secondary | ICD-10-CM | POA: Insufficient documentation

## 2015-03-16 DIAGNOSIS — Z720 Tobacco use: Secondary | ICD-10-CM | POA: Insufficient documentation

## 2015-03-16 DIAGNOSIS — L219 Seborrheic dermatitis, unspecified: Secondary | ICD-10-CM | POA: Diagnosis not present

## 2015-03-16 DIAGNOSIS — Z880 Allergy status to penicillin: Secondary | ICD-10-CM | POA: Insufficient documentation

## 2015-03-16 DIAGNOSIS — R21 Rash and other nonspecific skin eruption: Secondary | ICD-10-CM | POA: Diagnosis present

## 2015-03-16 MED ORDER — SELENIUM SULFIDE 2.5 % EX LOTN
1.0000 | TOPICAL_LOTION | Freq: Every day | CUTANEOUS | Status: DC | PRN
Start: 2015-03-16 — End: 2018-11-11

## 2015-03-16 NOTE — Discharge Instructions (Signed)
Follow-up with your primary care physician within one week.  Seborrheic Dermatitis Seborrheic dermatitis involves pink or red skin with greasy, flaky scales. This is often found on the scalp, eyebrows, nose, bearded area, and on or behind the ears. It can also occur on the central chest. It often occurs where there are more oil (sebaceous) glands. This condition is also known as dandruff. When this condition affects a baby's scalp, it is called cradle cap. It may come and go for no known reason. It can occur at any time of life from infancy to old age. CAUSES  The cause is unknown. It is not the result of too little moisture or too much oil. In some people, seborrheic dermatitis flare-ups seem to be triggered by stress. It also commonly occurs in people with certain diseases such as Parkinson's disease or HIV/AIDS. SYMPTOMS   Thick scales on the scalp.  Redness on the face or in the armpits.  The skin may seem oily or dry, but moisturizers do not help.  In infants, seborrheic dermatitis appears as scaly redness that does not seem to bother the baby. In some babies, it affects only the scalp. In others, it also affects the neck creases, armpits, groin, or behind the ears.  In adults and adolescents, seborrheic dermatitis may affect only the scalp. It may look patchy or spread out, with areas of redness and flaking. Other areas commonly affected include:  Eyebrows.  Eyelids.  Forehead.  Skin behind the ears.  Outer ears.  Chest.  Armpits.  Nose creases.  Skin creases under the breasts.  Skin between the buttocks.  Groin.  Some adults and adolescents feel itching or burning in the affected areas. DIAGNOSIS  Your caregiver can usually tell what the problem is by doing a physical exam. TREATMENT   Cortisone (steroid) ointments, creams, and lotions can help decrease inflammation.  Babies can be treated with baby oil to soften the scales, then they may be washed with baby  shampoo. If this does not help, a prescription topical steroid medicine may work.  Adults can use medicated shampoos.  Your caregiver may prescribe corticosteroid cream and shampoo containing an antifungal or yeast medicine (ketoconazole). Hydrocortisone or anti-yeast cream can be rubbed directly onto seborrheic dermatitis patches. Yeast does not cause seborrheic dermatitis, but it seems to add to the problem. In infants, seborrheic dermatitis is often worst during the first year of life. It tends to disappear on its own as the child grows. However, it may return during the teenage years. In adults and adolescents, seborrheic dermatitis tends to be a long-lasting condition that comes and goes over many years. HOME CARE INSTRUCTIONS   Use prescribed medicines as directed.  In infants, do not aggressively remove the scales or flakes on the scalp with a comb or by other means. This may lead to hair loss. SEEK MEDICAL CARE IF:   The problem does not improve from the medicated shampoos, lotions, or other medicines given by your caregiver.  You have any other questions or concerns. Document Released: 09/02/2005 Document Revised: 03/03/2012 Document Reviewed: 01/22/2010 [facility] Patient Information 2015 [place], [place]. This information is not intended to replace advice given to you by your health care provider. Make sure you discuss any questions you have with your health care provider.

## 2015-03-16 NOTE — ED Notes (Signed)
States she thinks she may have ring worm.

## 2015-03-16 NOTE — ED Provider Notes (Signed)
CSN: [id number]     Arrival date & time 03/16/15  1750 History  This chart was scribed for non-physician provider Celene Skeen, PA-C, working with Elwin Mocha, MD by Phillis Haggis, ED Scribe. This patient was seen in room MHFT2/MHFT2 and patient care was started at 6:14 PM.      Chief Complaint  Patient presents with  . Rash   Patient is a 29 y.o. female presenting with rash. The history is provided by the patient. No language interpreter was used.  Rash Location:  Head/neck Head/neck rash location:  Scalp Quality: itchiness   Severity:  Mild Onset quality:  Gradual Duration:  4 months Timing:  Constant Progression:  Unchanged Chronicity:  New Relieved by:  Nothing Worsened by:  Nothing tried Ineffective treatments: tea tree oil. Associated symptoms: no shortness of breath     HPI Comments: Katie Middleton is a 29 y.o. female who presents to the Emergency Department complaining of a rash on the scalp onset many months ago, about 4 months. She reports that it has been itchy and has been using tea tree oil to no relief. She states that she first thought she had a chemical burn from hair products. Her daughter has a similar itchy rash to her scalp. She is concerned she may have ringworm as one of her daughters had this a few years back. She denies new soaps or detergents, wounds to the area, cough or SOB. She reports allergies to Bactroban and penicillins.   History reviewed. No pertinent past medical history. Past Surgical History  Procedure Laterality Date  . Cesarean section    . Tubal ligation     No family history on file. History  Substance Use Topics  . Smoking status: Current Every Day Smoker  . Smokeless tobacco: Not on file  . Alcohol Use: Yes   OB History    No data available     Review of Systems  HENT: Negative for trouble swallowing.   Respiratory: Negative for cough and shortness of breath.   Skin: Positive for rash. Negative for wound.  All other systems  reviewed and are negative.  Allergies  Penicillins and Bactroban  Home Medications   Prior to Admission medications   Medication Sig Start Date End Date Taking? Authorizing Provider  ferrous sulfate 325 (65 FE) MG tablet Take 1 tablet (325 mg total) by mouth 2 (two) times daily. 04/05/14   Geoffery Lyons, MD  selenium sulfide (SELSUN) 2.5 % shampoo Apply 1 application topically daily as needed for irritation. 03/16/15   Tahirah Sara M Waneta Fitting, PA-C   BP 108/41 mmHg  Pulse 80  Temp(Src) 97.9 F (36.6 C) (Oral)  Resp 20  Ht 5\' 3"  (1.6 m)  Wt 135 lb (61.236 kg)  BMI 23.92 kg/m2  SpO2 100%  LMP 03/14/2015  Physical Exam  Constitutional: She is oriented to person, place, and time. She appears well-developed and well-nourished. No distress.  HENT:  Head: Normocephalic and atraumatic.    Mouth/Throat: Oropharynx is clear and moist.  Eyes: Conjunctivae and EOM are normal.  Neck: Normal range of motion. Neck supple.  Cardiovascular: Normal rate, regular rhythm and normal heart sounds.   Pulmonary/Chest: Effort normal and breath sounds normal. No respiratory distress.  Musculoskeletal: Normal range of motion. She exhibits no edema.  Neurological: She is alert and oriented to person, place, and time. No sensory deficit.  Skin: Skin is warm and dry.  Psychiatric: She has a normal mood and affect. Her behavior is normal.  Nursing note  and vitals reviewed.   ED Course  Procedures (including critical care time) DIAGNOSTIC STUDIES: Oxygen Saturation is 100% on RA, normal by my interpretation.    COORDINATION OF CARE: 6:16 PM-Discussed treatment plan which includes selenium sulfide shampoo with pt at bedside and pt agreed to plan.   Labs Review Labs Reviewed - No data to display  Imaging Review No results found.   EKG Interpretation None      MDM   Final diagnoses:  Seborrheic dermatitis of scalp   Nontoxic appearing, NAD. AF VSS. Rash has appearance of seborrheic dermatitis. This  does not have an appearance of ringworm which is the patient's concern. The rash is not annular, does not have any central clearing and is flaking. Treat with Selsun Blue shampoo. Advised follow-up with PCP within one week. Stable for discharge. Return precautions given. Patient states understanding of treatment care plan and is agreeable.  I personally performed the services described in this documentation, which was scribed in my presence. The recorded information has been reviewed and is accurate.  Kathrynn Speed, PA-C 03/16/15 1851  Elwin Mocha, MD 03/16/15 2113

## 2016-02-10 ENCOUNTER — Emergency Department (HOSPITAL_BASED_OUTPATIENT_CLINIC_OR_DEPARTMENT_OTHER)
Admission: EM | Admit: 2016-02-10 | Discharge: 2016-02-10 | Disposition: A | Discharge status: Home / Self Care | Payer: Medicaid Other | Attending: Emergency Medicine | Admitting: Emergency Medicine

## 2016-02-10 ENCOUNTER — Encounter (HOSPITAL_BASED_OUTPATIENT_CLINIC_OR_DEPARTMENT_OTHER): Payer: Self-pay | Admitting: Emergency Medicine

## 2016-02-10 DIAGNOSIS — F17200 Nicotine dependence, unspecified, uncomplicated: Secondary | ICD-10-CM | POA: Insufficient documentation

## 2016-02-10 DIAGNOSIS — L0102 Bockhart's impetigo: Secondary | ICD-10-CM | POA: Insufficient documentation

## 2016-02-10 DIAGNOSIS — L739 Follicular disorder, unspecified: Secondary | ICD-10-CM

## 2016-02-10 NOTE — ED Notes (Signed)
MD at bedside. 

## 2016-02-10 NOTE — ED Notes (Addendum)
Pt c/o hard knot to vagina, rash to stomach and buttocks. Denies pain. First noticed Tuesday. Using topical cream. Pt is currently being treated for a yeast infection and BV.

## 2016-02-10 NOTE — ED Provider Notes (Signed)
CSN: [id number]     Arrival date & time 02/10/16  0857 History   First MD Initiated Contact with Patient 02/10/16 [phone]     Chief Complaint  Patient presents with  . Abscess     (Consider location/radiation/quality/duration/timing/severity/associated sxs/prior Treatment) HPI Patient complains of "pimples" on trunk and on left buttock noted 2 days ago. She also noted a painless lesion on external geitalia 2 days ago after she shaved there. denies fever denies feeling ill. Denies other associated symptoms. No treatment prior to coming here. Not using topical cream. She's currently usingcream for BV intravaginally History reviewed. No pertinent past medical history. Past Surgical History  Procedure Laterality Date  . Cesarean section    . Tubal ligation     No family history on file. Social History  Substance Use Topics  . Smoking status: Current Every Day Smoker  . Smokeless tobacco: None  . Alcohol Use: Yes   OB History    No data available     Review of Systems  Constitutional: Negative.   HENT: Negative.   Respiratory: Negative.   Cardiovascular: Negative.   Gastrointestinal: Negative.   Genitourinary:       Currently under treatment for bacterial vaginosis  Musculoskeletal: Negative.   Skin: Positive for rash.  Neurological: Negative.   Psychiatric/Behavioral: Negative.   All other systems reviewed and are negative.     Allergies  Penicillins and Bactroban  Home Medications   Prior to Admission medications   Medication Sig Start Date End Date Taking? Authorizing Provider  ferrous sulfate 325 (65 FE) MG tablet Take 1 tablet (325 mg total) by mouth 2 (two) times daily. 04/05/14   Geoffery Lyons, MD  selenium sulfide (SELSUN) 2.5 % shampoo Apply 1 application topically daily as needed for irritation. 03/16/15   Robyn M Hess, PA-C   BP 111/64 mmHg  Pulse 96  Temp(Src) 98.8 F (37.1 C) (Oral)  Resp 18  Ht 5\' 3"  (1.6 m)  Wt 140 lb (63.504 kg)  BMI 24.81 kg/m2   SpO2 100%  LMP 02/05/2016 Physical Exam  Constitutional: She appears well-developed and well-nourished.  HENT:  Head: Normocephalic and atraumatic.  Eyes: Conjunctivae are normal. Pupils are equal, round, and reactive to light.  Neck: Neck supple. No tracheal deviation present. No thyromegaly present.  Cardiovascular: Normal rate and regular rhythm.   No murmur heard. Pulmonary/Chest: Effort normal and breath sounds normal.  Abdominal: Soft. Bowel sounds are normal. She exhibits no distension. There is no tenderness.  Musculoskeletal: Normal range of motion. She exhibits no edema or tenderness.  Neurological: She is alert. Coordination normal.  Skin: Skin is warm and dry. Rash noted.  There are 2 tiny pustules 1-2 mm on trunk and one tiny pustule 1-2 mm on left buttock, consistent with Foley colitis. On symphysis pubis there is a 3 mm firm lesion which is non-tender nonfluctuant not read and skin tone colored  Psychiatric: She has a normal mood and affect.  Nursing note and vitals reviewed.   ED Course  Procedures (including critical care time) Labs Review Labs Reviewed - No data to display  Imaging Review No results found. I have personally reviewed and evaluated these images and lab results as part of my medical decision-making.   EKG Interpretation None      MDM  Rash on trunk and buttock consistent with topical liquid colitis plan warm compresses. She is advised to watch lesion on symphysis pubis at home. If it changes in color or size she is  referred to dermatologist, Dr. Margo Aye. I counseled patient for 5 minutes on smoking cessation Diagnosis #1 folliculitis #2 tobacco abuse Final diagnoses:  None        Doug Sou, MD 02/10/16 [phone]

## 2016-02-10 NOTE — Discharge Instructions (Signed)
Folliculitis Hold a warm moist washcloth over the"pimples" on your trunk and buttocks 4 times daily 30 minutes at a time or stand under the warm shower and let the water hit the area. Continue to watch the "knot" on your outer vagina for the next several weeks. If it changes, increases in size or color call Dr. Margo Aye, dermatologist to schedule an appointment. Return if you feel worse for any reason. Ask your doctor to help you to stop smoking Folliculitis is redness, soreness, and swelling (inflammation) of the hair follicles. This condition can occur anywhere on the body. People with weakened immune systems, diabetes, or obesity have a greater risk of getting folliculitis. CAUSES  Bacterial infection. This is the most common cause.  Fungal infection.  Viral infection.  Contact with certain chemicals, especially oils and tars. Long-term folliculitis can result from bacteria that live in the nostrils. The bacteria may trigger multiple outbreaks of folliculitis over time. SYMPTOMS Folliculitis most commonly occurs on the scalp, thighs, legs, back, buttocks, and areas where hair is shaved frequently. An early sign of folliculitis is a small, white or yellow, pus-filled, itchy lesion (pustule). These lesions appear on a red, inflamed follicle. They are usually less than 0.2 inches (5 mm) wide. When there is an infection of the follicle that goes deeper, it becomes a boil or furuncle. A group of closely packed boils creates a larger lesion (carbuncle). Carbuncles tend to occur in hairy, sweaty areas of the body. DIAGNOSIS  Your caregiver can usually tell what is wrong by doing a physical exam. A sample may be taken from one of the lesions and tested in a lab. This can help determine what is causing your folliculitis. TREATMENT  Treatment may include:  Applying warm compresses to the affected areas.  Taking antibiotic medicines orally or applying them to the skin.  Draining the lesions if they  contain a large amount of pus or fluid.  Laser hair removal for cases of long-lasting folliculitis. This helps to prevent regrowth of the hair. HOME CARE INSTRUCTIONS  Apply warm compresses to the affected areas as directed by your caregiver.  If antibiotics are prescribed, take them as directed. Finish them even if you start to feel better.  You may take over-the-counter medicines to relieve itching.  Do not shave irritated skin.  Follow up with your caregiver as directed. SEEK IMMEDIATE MEDICAL CARE IF:   You have increasing redness, swelling, or pain in the affected area.  You have a fever. MAKE SURE YOU:  Understand these instructions.  Will watch your condition.  Will get help right away if you are not doing well or get worse.   This information is not intended to replace advice given to you by your health care provider. Make sure you discuss any questions you have with your health care provider.   Document Released: 11/11/2001 Document Revised: 09/23/2014 Document Reviewed: 12/03/2011 Elsevier Interactive Patient Education [employer]

## 2017-01-10 ENCOUNTER — Encounter (HOSPITAL_BASED_OUTPATIENT_CLINIC_OR_DEPARTMENT_OTHER): Payer: Self-pay | Admitting: *Deleted

## 2017-01-10 ENCOUNTER — Emergency Department (HOSPITAL_BASED_OUTPATIENT_CLINIC_OR_DEPARTMENT_OTHER)
Admission: EM | Admit: 2017-01-10 | Discharge: 2017-01-10 | Disposition: A | Discharge status: Home / Self Care | Payer: No Typology Code available for payment source | Attending: Emergency Medicine | Admitting: Emergency Medicine

## 2017-01-10 DIAGNOSIS — R350 Frequency of micturition: Secondary | ICD-10-CM

## 2017-01-10 DIAGNOSIS — N760 Acute vaginitis: Secondary | ICD-10-CM | POA: Insufficient documentation

## 2017-01-10 DIAGNOSIS — F17210 Nicotine dependence, cigarettes, uncomplicated: Secondary | ICD-10-CM | POA: Diagnosis not present

## 2017-01-10 DIAGNOSIS — B9689 Other specified bacterial agents as the cause of diseases classified elsewhere: Secondary | ICD-10-CM

## 2017-01-10 LAB — WET PREP, GENITAL
SPERM: NONE SEEN
Trich, Wet Prep: NONE SEEN
YEAST WET PREP: NONE SEEN

## 2017-01-10 LAB — URINALYSIS, MICROSCOPIC (REFLEX)

## 2017-01-10 LAB — URINALYSIS, ROUTINE W REFLEX MICROSCOPIC
BILIRUBIN URINE: NEGATIVE
Glucose, UA: NEGATIVE mg/dL
HGB URINE DIPSTICK: NEGATIVE
KETONES UR: NEGATIVE mg/dL
Nitrite: NEGATIVE
Protein, ur: 30 mg/dL — AB
SPECIFIC GRAVITY, URINE: 1.03 (ref 1.005–1.030)
pH: 8 (ref 5.0–8.0)

## 2017-01-10 LAB — PREGNANCY, URINE: PREG TEST UR: NEGATIVE

## 2017-01-10 MED ORDER — METRONIDAZOLE 500 MG PO TABS: 500.0000 mg | ORAL_TABLET | Freq: Two times a day (BID) | ORAL | 0 refills | Status: AC

## 2017-01-10 MED FILL — metroNIDAZOLE 500 MG TABS: 500 | 7 days supply | Qty: 14 | Fill #0

## 2017-01-10 NOTE — ED Triage Notes (Signed)
Pt reports urinary frequency and urgency that began today. Denies dysuria, hematuria. Reports vaginal discharge with odor. Denies known exposure to STDs (reports unprotected sex with one partner). Denies fever, n/v/d.

## 2017-01-10 NOTE — ED Provider Notes (Signed)
[facility] Provider Note   CSN: [id number] Arrival date & time: 01/10/17  9233     History   Chief Complaint Chief Complaint  Patient presents with  . Urinary Frequency    HPI Katie Middleton is a 31 y.o. female presenting with urinary frequency and urgency since this morning. She also reports malodorous vaginal discharge for the past Year with hx of unprotected sex with same partner. She was told to use a gel to stabilize procedure over-the-counter with some relief. LMP a month ago. Patient had a bilateral tubal ligation. She reports having had a workup for vaginal discharge by her gynecologist and wants to wait for her upcoming appointment to have pelvic exam and labs performed. She is concerned today about urinary frequency and urgency and wants to make sure it is not a UTI. She denies fever, chills, abdominal pain, back pain, hematuria or other symptoms.  Denies  HPI  History reviewed. No pertinent past medical history.  There are no active problems to display for this patient.   Past Surgical History:  Procedure Laterality Date  . CESAREAN SECTION    . TUBAL LIGATION      OB History    No data available       Home Medications    Prior to Admission medications   Medication Sig Start Date End Date Taking? Authorizing Provider  ferrous sulfate 325 (65 FE) MG tablet Take 1 tablet (325 mg total) by mouth 2 (two) times daily. 04/05/14   Geoffery Lyons, MD  metroNIDAZOLE (FLAGYL) 500 MG tablet Take 1 tablet (500 mg total) by mouth 2 (two) times daily. 01/10/17 01/17/17  Georgiana Shore, PA-C  selenium sulfide (SELSUN) 2.5 % shampoo Apply 1 application topically daily as needed for irritation. 03/16/15   Kathrynn Speed, PA-C    Family History No family history on file.  Social History Social History  Substance Use Topics  . Smoking status: Current Every Day Smoker    Types: Cigarettes  . Smokeless tobacco: Never Used  . Alcohol use Yes     Comment: occ      Allergies   Penicillins and Bactroban [mupirocin calcium]   Review of Systems Review of Systems  Constitutional: Negative for chills and fever.  HENT: Negative for ear pain and sore throat.   Eyes: Negative for pain and visual disturbance.  Respiratory: Negative for cough, chest tightness, shortness of breath, wheezing and stridor.   Cardiovascular: Negative for chest pain, palpitations and leg swelling.  Gastrointestinal: Negative for abdominal distention, abdominal pain, blood in stool, diarrhea, nausea and vomiting.  Genitourinary: Positive for frequency, urgency and vaginal discharge. Negative for decreased urine volume, difficulty urinating, dysuria, flank pain, hematuria, pelvic pain and vaginal pain.  Musculoskeletal: Negative for arthralgias, back pain and myalgias.  Skin: Negative for color change, pallor and rash.  Neurological: Negative for dizziness, seizures, syncope and weakness.     Physical Exam Updated Vital Signs BP (!) 103/54 (BP Location: Right Arm)   Pulse 62   Temp 98.3 F (36.8 C) (Oral)   Resp 14   Ht 5\' 3"  (1.6 m)   Wt 56.7 kg   LMP 12/15/2016 (Approximate)   SpO2 100%   BMI 22.14 kg/m   Physical Exam  Constitutional: She appears well-developed and well-nourished. No distress.  Patient is afebrile, nontoxic-appearing, sitting comfortably in bed in no acute distress.  HENT:  Head: Normocephalic and atraumatic.  Eyes: Conjunctivae and EOM are normal. Right eye exhibits no discharge. Left  eye exhibits no discharge.  Neck: Normal range of motion.  Cardiovascular: Normal rate, regular rhythm and normal heart sounds.   No murmur heard. Pulmonary/Chest: Effort normal and breath sounds normal. No respiratory distress. She has no wheezes. She has no rales. She exhibits no tenderness.  Abdominal: Soft. She exhibits no distension and no mass. There is no tenderness. There is no rebound and no guarding.  No CVA tenderness  Genitourinary: Vagina  normal.  Genitourinary Comments: Normal appearing white discharge in vaginal vault. No cervical motion tenderness. No adnexal masses appreciated.  Musculoskeletal: She exhibits no edema.  Neurological: She is alert.  Skin: Skin is warm and dry. She is not diaphoretic. No erythema. No pallor.  Psychiatric: She has a normal mood and affect.  Nursing note and vitals reviewed.    ED Treatments / Results  Labs (all labs ordered are listed, but only abnormal results are displayed) Labs Reviewed  WET PREP, GENITAL - Abnormal; Notable for the following:       Result Value   Clue Cells Wet Prep HPF POC PRESENT (*)    WBC, Wet Prep HPF POC MODERATE (*)    All other components within normal limits  URINALYSIS, ROUTINE W REFLEX MICROSCOPIC - Abnormal; Notable for the following:    APPearance CLOUDY (*)    Protein, ur 30 (*)    Leukocytes, UA SMALL (*)    All other components within normal limits  URINALYSIS, MICROSCOPIC (REFLEX) - Abnormal; Notable for the following:    Bacteria, UA RARE (*)    Squamous Epithelial / LPF 0-5 (*)    All other components within normal limits  URINE CULTURE  PREGNANCY, URINE  RPR  HIV ANTIBODY (ROUTINE TESTING)  GC/CHLAMYDIA PROBE AMP ([place]) NOT AT [facility]    EKG  EKG Interpretation None       Radiology No results found.  Procedures Procedures (including critical care time)  Medications Ordered in ED Medications - No data to display   Initial Impression / Assessment and Plan / ED Course  I have reviewed the triage vital signs and the nursing notes.  Pertinent labs & imaging results that were available during my care of the patient were reviewed by me and considered in my medical decision making (see chart for details).    Patient presenting with Urinary frequency and urgency upon awakening this morning.Urine pregnancy negative. Bilateral tubal ligation.   Discussed negative urine analysis with patient and made recommendations regarding  Pelvic exam, patient agreed to have pelvic exam performed today to given prior hx of BV and remote hx of trichomonas. Pelvic exam reassuring, normal appearing mild white discharge. No cervical motion tenderness or adnexal mass. Wet prep with evidence of BV. Will treat with metronidazole and discharge home with PCP and gyn follow up. Patient was advised to avoid any alcohol while on this medication.  Patient will be contacted if any STI panel results are abnormal.  Discussed strict return precautions and advised to return to the emergency department if experiencing any new or worsening symptoms. Instructions were understood and patient agreed with discharge plan.  Final Clinical Impressions(s) / ED Diagnoses   Final diagnoses:  BV (bacterial vaginosis)  Urinary frequency    New Prescriptions New Prescriptions   METRONIDAZOLE (FLAGYL) 500 MG TABLET    Take 1 tablet (500 mg total) by mouth 2 (two) times daily.     Georgiana Shore, PA-C 01/10/17 1200    Cathren Laine, MD 01/10/17 1450

## 2017-01-10 NOTE — Discharge Instructions (Signed)
As discussed, please take the entire course of antibiotics and refrain from any alcohol while on this medication. Follow up with your gynecologist at your upcoming appointment.

## 2017-01-11 LAB — URINE CULTURE

## 2017-01-11 LAB — HIV ANTIBODY (ROUTINE TESTING W REFLEX): HIV Screen 4th Generation wRfx: NONREACTIVE

## 2017-01-13 LAB — GC/CHLAMYDIA PROBE AMP (~~LOC~~) NOT AT ARMC
CHLAMYDIA, DNA PROBE: NEGATIVE
NEISSERIA GONORRHEA: NEGATIVE

## 2017-01-13 LAB — RPR, QUANT+TP ABS (REFLEX): T Pallidum Abs: NEGATIVE

## 2017-01-13 LAB — RPR: RPR: REACTIVE — AB

## 2017-01-14 ENCOUNTER — Encounter (HOSPITAL_BASED_OUTPATIENT_CLINIC_OR_DEPARTMENT_OTHER): Payer: Self-pay | Admitting: *Deleted

## 2017-01-14 ENCOUNTER — Emergency Department (HOSPITAL_BASED_OUTPATIENT_CLINIC_OR_DEPARTMENT_OTHER)
Admission: EM | Admit: 2017-01-14 | Discharge: 2017-01-14 | Disposition: A | Discharge status: Home / Self Care | Payer: PRIVATE HEALTH INSURANCE | Attending: Emergency Medicine | Admitting: Emergency Medicine

## 2017-01-14 DIAGNOSIS — F17210 Nicotine dependence, cigarettes, uncomplicated: Secondary | ICD-10-CM | POA: Insufficient documentation

## 2017-01-14 DIAGNOSIS — Z79899 Other long term (current) drug therapy: Secondary | ICD-10-CM | POA: Insufficient documentation

## 2017-01-14 DIAGNOSIS — R300 Dysuria: Secondary | ICD-10-CM | POA: Diagnosis present

## 2017-01-14 DIAGNOSIS — A539 Syphilis, unspecified: Secondary | ICD-10-CM | POA: Insufficient documentation

## 2017-01-14 MED ORDER — DOXYCYCLINE HYCLATE 100 MG PO CAPS: 100.0000 mg | ORAL_CAPSULE | Freq: Two times a day (BID) | ORAL | 0 refills | Status: DC

## 2017-01-14 NOTE — ED Triage Notes (Signed)
Pt states here for STD tx, seen here Friday , pt called to come back for tx

## 2017-01-14 NOTE — Discharge Instructions (Signed)
Begin taking doxycycline twice daily for 14 days. Follow up with PCP for further evaluation. Return to ED for worsening symptoms, bleeding, discharge, fever or neurological changes.

## 2017-01-14 NOTE — ED Provider Notes (Signed)
[facility]   CSN: [id number] Arrival date & time: 01/14/17  1710     History   Chief Complaint Chief Complaint  Patient presents with  . SEXUALLY TRANSMITTED DISEASE    HPI Katie Middleton is a 31 y.o. female.  Patient presents for follow up of STD panel results. Appears that patient RPR was reactive and titer >1:1 with negative Abs. GC/chlamydia negative. Patient states she has been taking her Flagyl for BV as previously diagnosed.       History reviewed. No pertinent past medical history.  There are no active problems to display for this patient.   Past Surgical History:  Procedure Laterality Date  . CESAREAN SECTION    . TUBAL LIGATION      OB History    No data available       Home Medications    Prior to Admission medications   Medication Sig Start Date End Date Taking? Authorizing Provider  doxycycline (VIBRAMYCIN) 100 MG capsule Take 1 capsule (100 mg total) by mouth 2 (two) times daily. 01/14/17   Machi Whittaker, PA-C  ferrous sulfate 325 (65 FE) MG tablet Take 1 tablet (325 mg total) by mouth 2 (two) times daily. 04/05/14   Geoffery Lyons, MD  metroNIDAZOLE (FLAGYL) 500 MG tablet Take 1 tablet (500 mg total) by mouth 2 (two) times daily. 01/10/17 01/17/17  Georgiana Shore, PA-C  selenium sulfide (SELSUN) 2.5 % shampoo Apply 1 application topically daily as needed for irritation. 03/16/15   Kathrynn Speed, PA-C    Family History History reviewed. No pertinent family history.  Social History Social History  Substance Use Topics  . Smoking status: Current Every Day Smoker    Types: Cigarettes  . Smokeless tobacco: Never Used  . Alcohol use Yes     Comment: occ     Allergies   Penicillins and Bactroban [mupirocin calcium]   Review of Systems Review of Systems  Genitourinary: Positive for dysuria and vaginal discharge.     Physical Exam Updated Vital Signs BP 117/69 (BP Location: Left Arm)   Pulse 87   Temp 98.5 F (36.9  C) (Oral)   Resp 20   Ht 5\' 3"  (1.6 m)   Wt 65.8 kg   LMP 01/14/2017   SpO2 100%   BMI 25.69 kg/m   Physical Exam  Constitutional: She appears well-developed and well-nourished. No distress.  HENT:  Head: Normocephalic and atraumatic.  Eyes: Conjunctivae and EOM are normal. No scleral icterus.  Neck: Normal range of motion.  Pulmonary/Chest: Effort normal. No respiratory distress.  Neurological: She is alert.  Skin: She is not diaphoretic.  Psychiatric: She has a normal mood and affect.  Nursing note and vitals reviewed.    ED Treatments / Results  Labs (all labs ordered are listed, but only abnormal results are displayed) Labs Reviewed - No data to display  EKG  EKG Interpretation None       Radiology No results found.  Procedures Procedures (including critical care time)  Medications Ordered in ED Medications - No data to display   Initial Impression / Assessment and Plan / ED Course  I have reviewed the triage vital signs and the nursing notes.  Pertinent labs & imaging results that were available during my care of the patient were reviewed by me and considered in my medical decision making (see chart for details).     Patient RPR reactive, titer 1:1 and negative treponemal ab. Will need to be treated  for syphilis but unsure if this is false or true positive. Patient states "doesn't want to talk, just wants my medication so I can leave." Since noted to be PCN allergic on chart, asked what reaction was. States it was when she was a baby and unsure what reaction was but "doesn't want to take a chance." Per uptodate, will give doxycycline as alternative. Patient was advised to follow up with PCP for further evaluation and labwork, and to complete entire course of antibiotics. In the case of false positive, recommended to still treat but follow up with serological testing with PCP. Return precautions given.  Final Clinical Impressions(s) / ED Diagnoses    Final diagnoses:  Syphilis    New Prescriptions Discharge Medication List as of 01/14/2017  5:50 PM    START taking these medications   Details  doxycycline (VIBRAMYCIN) 100 MG capsule Take 1 capsule (100 mg total) by mouth 2 (two) times daily., Starting Tue 01/14/2017, Print         Aslynn Brunetti [place], PA-C 01/14/17 1805    Arby Barrette, MD 01/18/17 1113

## 2017-04-22 ENCOUNTER — Encounter (HOSPITAL_BASED_OUTPATIENT_CLINIC_OR_DEPARTMENT_OTHER): Payer: Self-pay | Admitting: *Deleted

## 2017-04-22 ENCOUNTER — Emergency Department (HOSPITAL_BASED_OUTPATIENT_CLINIC_OR_DEPARTMENT_OTHER)
Admission: EM | Admit: 2017-04-22 | Discharge: 2017-04-22 | Disposition: A | Discharge status: Home / Self Care | Payer: PRIVATE HEALTH INSURANCE | Attending: Emergency Medicine | Admitting: Emergency Medicine

## 2017-04-22 DIAGNOSIS — S80862A Insect bite (nonvenomous), left lower leg, initial encounter: Secondary | ICD-10-CM | POA: Insufficient documentation

## 2017-04-22 DIAGNOSIS — Y939 Activity, unspecified: Secondary | ICD-10-CM | POA: Insufficient documentation

## 2017-04-22 DIAGNOSIS — Y929 Unspecified place or not applicable: Secondary | ICD-10-CM | POA: Insufficient documentation

## 2017-04-22 DIAGNOSIS — R21 Rash and other nonspecific skin eruption: Secondary | ICD-10-CM

## 2017-04-22 DIAGNOSIS — Y999 Unspecified external cause status: Secondary | ICD-10-CM | POA: Insufficient documentation

## 2017-04-22 DIAGNOSIS — W57XXXA Bitten or stung by nonvenomous insect and other nonvenomous arthropods, initial encounter: Secondary | ICD-10-CM | POA: Insufficient documentation

## 2017-04-22 DIAGNOSIS — F17210 Nicotine dependence, cigarettes, uncomplicated: Secondary | ICD-10-CM | POA: Insufficient documentation

## 2017-04-22 DIAGNOSIS — S80861A Insect bite (nonvenomous), right lower leg, initial encounter: Secondary | ICD-10-CM | POA: Insufficient documentation

## 2017-04-22 HISTORY — DX: Anemia, unspecified: D64.9

## 2017-04-22 MED ORDER — HYDROCORTISONE 1 % EX CREA: 1.0000 "application " | TOPICAL_CREAM | Freq: Two times a day (BID) | CUTANEOUS | 0 refills | Status: DC | PRN

## 2017-04-22 NOTE — Discharge Instructions (Signed)
Take use hydrocortisone cream as prescribed. May consider over the counter antihistamine like benadryl or zyrtec as needed for itching. Continue your usual home medications. Get plenty of rest and drink plenty of fluids. Avoid any known triggers. Wash all linens in hot water. Follow up with your regular doctor in 1 week for recheck of symptoms. Return to the ER for changes or worsening symptoms

## 2017-04-22 NOTE — ED Triage Notes (Addendum)
Pt reports waking this morning and noting some red, raised areas to R thumb (noted last night), L foot and bil legs. Denies fever, n/v/d, pain. Reports transient, mild itching. Denies new place of residence, exposure to new chemicals/dyes/soap.

## 2017-04-22 NOTE — ED Provider Notes (Signed)
[facility]   CSN: [id number] Arrival date & time: 04/22/17  1008     History   Chief Complaint No chief complaint on file.   HPI Katie Middleton is a 31 y.o. female with a PMHx of anemia, who presents to the ED with complaints of insect bites/rash that she noticed yesterday on her right thumb and both legs. She states that it itchy and red, not painful, improves with water exposure, no other treatments tried, and has known aggravating factors. 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 She has not seen anything bite her. She also just started metronidazole yesterday however she has taken this medication before and has never had a reaction. She denies any new changes in lotions or creams, cosmetics, chemical exposure, food changes, plants or animal exposure, sick contacts, or contact with similar rash. No affected individuals at home. She denies any red streaking, warmth to the area, swelling, or drainage. She also denies any fevers, chills, CP, SOB, abd pain, N/V/D/C, hematuria, dysuria, myalgias, arthralgias, numbness, tingling, focal weakness, or any other complaints at this time.    The history is provided by the patient and medical records. No language interpreter was used.  Rash   This is a new problem. The current episode started yesterday. The problem has not changed since onset.The problem is associated with an unknown factor. There has been no fever. The rash is present on the right lower leg, right upper leg, left upper leg, left lower leg and right hand. The patient is experiencing no pain. The pain has been constant since onset. Associated symptoms include itching. Pertinent negatives include no pain and no weeping.  Treatments tried: water. The treatment provided mild relief.    Past Medical History:  Diagnosis Date  . Anemia     There are no active problems to display for this patient.   Past Surgical History:  Procedure Laterality Date  . CESAREAN SECTION    . TUBAL LIGATION      OB History    No data available       Home Medications    Prior to Admission medications   Medication Sig Start Date End Date Taking? Authorizing Provider  metroNIDAZOLE (FLAGYL) 500 MG tablet Take 500 mg by mouth 2 (two) times daily.   Yes [provider]  selenium sulfide (SELSUN) 2.5 % shampoo Apply 1 application topically daily as needed for irritation. 03/16/15  Yes Hess, Robyn M, PA-C  doxycycline (VIBRAMYCIN) 100 MG capsule Take 1 capsule (100 mg total) by mouth 2 (two) times daily. 01/14/17   Khatri, Hina, PA-C  ferrous sulfate 325 (65 FE) MG tablet Take 1 tablet (325 mg total) by mouth 2 (two) times daily. 04/05/14   Geoffery Lyons, MD    Family History No family history on file.  Social History Social History  Substance Use Topics  . Smoking status: Current Every Day Smoker    Types: Cigarettes  . Smokeless tobacco: Never Used  . Alcohol use Yes     Comment: occ     Allergies   Penicillins and Bactroban [mupirocin calcium]   Review of Systems Review of Systems  Constitutional: Negative for chills and fever.  Respiratory: Negative for shortness of breath.   Cardiovascular: Negative for chest pain.  Gastrointestinal: Negative for abdominal pain, constipation, diarrhea, nausea and vomiting.  Genitourinary: Negative for dysuria and hematuria.  Musculoskeletal: Negative for arthralgias and myalgias.  Skin: Positive for itching and rash.  Allergic/Immunologic: Negative for immunocompromised state.  Neurological: Negative for weakness and numbness.  Psychiatric/Behavioral: Negative for confusion.   All other systems reviewed and are negative for acute change except as noted in the  HPI.    Physical Exam Updated Vital Signs BP 101/61 (BP Location: Left Arm)   Pulse 93   Temp 98.3 F (36.8 C) (Oral)   Resp 16   Ht 5\' 3"  (1.6 m)   Wt 60.3 kg (133 lb)   LMP 03/16/2017 (Approximate)   SpO2 100%   BMI 23.56 kg/m   Physical Exam  Constitutional: She is oriented to person, place, and time. Vital signs are normal. She appears well-developed and well-nourished.  Non-toxic appearance. No distress.  Afebrile, nontoxic, NAD  HENT:  Head: Normocephalic and atraumatic.  Mouth/Throat: Mucous membranes are normal.  Eyes: Conjunctivae and EOM are normal. Right eye exhibits no discharge. Left eye exhibits no discharge.  Neck: Normal range of motion. Neck supple.  Cardiovascular: Normal rate and intact distal pulses.   Pulmonary/Chest: Effort normal. No respiratory distress.  Abdominal: Normal appearance. She exhibits no distension.  Musculoskeletal: Normal range of motion.  Neurological: She is alert and oriented to person, place, and time. She has normal strength. No sensory deficit.  Skin: Skin is warm, dry and intact. Rash noted.  Several small erythematous ?insect bites scattered over R thumb and b/l legs, no surrounding cellulitis, no abscesses, no interdigital webspace involvement, no burrowing. No intertrigonous involvement.   Psychiatric: She has a normal mood and affect. Her behavior is normal.  Nursing note and vitals reviewed.    ED Treatments / Results  Labs (all labs ordered are listed, but only abnormal results are displayed) Labs Reviewed - No data to display  EKG  EKG Interpretation None       Radiology No results found.  Procedures Procedures (including critical care time)  Medications Ordered in ED Medications - No data to display   Initial Impression / Assessment and Plan / ED Course  I have reviewed the triage vital signs and the nursing notes.  Pertinent labs & imaging results that were available during my care of the patient were  reviewed by me and considered in my medical decision making (see chart for details).     31 y.o. female here with multiple ?insect bites to R thumb and b/l legs. Slightly itchy, no pain. No evidence of cellulitis or infect, appear to be small insect bites. Will rx hydrocortisone cream, advised other OTC remedies for symptomatic relief. F/up with PCP in 1wk for recheck. Avoidance of triggers, if found, discussed. Wash linens just in case, but doesn't look like scabies. I explained the diagnosis and have given explicit precautions to return to the ER including for any other new or worsening symptoms. The patient understands and accepts the medical plan as it's been dictated and I have answered their questions. Discharge instructions concerning home care and prescriptions have been given. The patient is STABLE and is discharged to home in good condition.    Final Clinical Impressions(s) / ED Diagnoses   Final diagnoses:  Insect bite, initial encounter  Rash    New Prescriptions New Prescriptions   HYDROCORTISONE CREAM 1 %    Apply 1 application  topically 2 (two) times daily as needed for itching.     [address] [place], [place] 04/22/17 1102    Rolan Bucco, MD 04/22/17 1158

## 2017-05-31 ENCOUNTER — Emergency Department (HOSPITAL_BASED_OUTPATIENT_CLINIC_OR_DEPARTMENT_OTHER)
Admission: EM | Admit: 2017-05-31 | Discharge: 2017-05-31 | Disposition: A | Discharge status: Home / Self Care | Payer: PRIVATE HEALTH INSURANCE | Attending: Emergency Medicine | Admitting: Emergency Medicine

## 2017-05-31 ENCOUNTER — Encounter (HOSPITAL_BASED_OUTPATIENT_CLINIC_OR_DEPARTMENT_OTHER): Payer: Self-pay | Admitting: Emergency Medicine

## 2017-05-31 DIAGNOSIS — R21 Rash and other nonspecific skin eruption: Secondary | ICD-10-CM | POA: Diagnosis present

## 2017-05-31 NOTE — ED Notes (Signed)
Pt walked by the nurses station and stated we were taking too long. Pt left out the exit.

## 2017-05-31 NOTE — ED Triage Notes (Signed)
Pt reports multiple "pimples" to her buttocks.

## 2017-05-31 NOTE — ED Notes (Signed)
Patient refused RN examination of rash.

## 2017-08-17 ENCOUNTER — Emergency Department (HOSPITAL_BASED_OUTPATIENT_CLINIC_OR_DEPARTMENT_OTHER)
Admission: EM | Admit: 2017-08-17 | Discharge: 2017-08-17 | Disposition: A | Discharge status: Home / Self Care | Payer: PRIVATE HEALTH INSURANCE | Attending: Emergency Medicine | Admitting: Emergency Medicine

## 2017-08-17 DIAGNOSIS — A6922 Other neurologic disorders in Lyme disease: Secondary | ICD-10-CM | POA: Insufficient documentation

## 2017-08-17 DIAGNOSIS — Z5321 Procedure and treatment not carried out due to patient leaving prior to being seen by health care provider: Secondary | ICD-10-CM | POA: Insufficient documentation

## 2017-08-17 DIAGNOSIS — Z119 Encounter for screening for infectious and parasitic diseases, unspecified: Secondary | ICD-10-CM | POA: Insufficient documentation

## 2017-08-17 NOTE — ED Notes (Signed)
Pt angry in triage about the wait, ripped off and bp cuff and left traige room and lobby

## 2018-11-11 ENCOUNTER — Emergency Department (HOSPITAL_COMMUNITY): Payer: Medicaid Other

## 2018-11-11 ENCOUNTER — Observation Stay (HOSPITAL_COMMUNITY)
Admission: EM | Admit: 2018-11-11 | Discharge: 2018-11-12 | Discharge status: Against Medical Advice | Payer: Medicaid Other | Attending: Internal Medicine | Admitting: Internal Medicine

## 2018-11-11 ENCOUNTER — Encounter (HOSPITAL_COMMUNITY): Payer: Self-pay

## 2018-11-11 ENCOUNTER — Other Ambulatory Visit: Payer: Self-pay

## 2018-11-11 DIAGNOSIS — D509 Iron deficiency anemia, unspecified: Secondary | ICD-10-CM | POA: Diagnosis present

## 2018-11-11 DIAGNOSIS — D649 Anemia, unspecified: Secondary | ICD-10-CM | POA: Diagnosis present

## 2018-11-11 DIAGNOSIS — Z9851 Tubal ligation status: Secondary | ICD-10-CM | POA: Diagnosis not present

## 2018-11-11 DIAGNOSIS — R112 Nausea with vomiting, unspecified: Secondary | ICD-10-CM | POA: Diagnosis not present

## 2018-11-11 DIAGNOSIS — F17210 Nicotine dependence, cigarettes, uncomplicated: Secondary | ICD-10-CM | POA: Diagnosis not present

## 2018-11-11 DIAGNOSIS — R1110 Vomiting, unspecified: Secondary | ICD-10-CM

## 2018-11-11 DIAGNOSIS — R102 Pelvic and perineal pain: Principal | ICD-10-CM | POA: Insufficient documentation

## 2018-11-11 DIAGNOSIS — R109 Unspecified abdominal pain: Secondary | ICD-10-CM | POA: Diagnosis present

## 2018-11-11 DIAGNOSIS — N859 Noninflammatory disorder of uterus, unspecified: Secondary | ICD-10-CM | POA: Diagnosis present

## 2018-11-11 LAB — URINALYSIS, ROUTINE W REFLEX MICROSCOPIC
Bilirubin Urine: NEGATIVE
Glucose, UA: NEGATIVE mg/dL
Ketones, ur: 80 mg/dL — AB
Leukocytes,Ua: NEGATIVE
NITRITE: NEGATIVE
Protein, ur: NEGATIVE mg/dL
Specific Gravity, Urine: 1.026 (ref 1.005–1.030)
pH: 6 (ref 5.0–8.0)

## 2018-11-11 LAB — COMPREHENSIVE METABOLIC PANEL
ALT: 9 U/L (ref 0–44)
AST: 20 U/L (ref 15–41)
Albumin: 3.7 g/dL (ref 3.5–5.0)
Alkaline Phosphatase: 63 U/L (ref 38–126)
Anion gap: 8 (ref 5–15)
BUN: 8 mg/dL (ref 6–20)
CALCIUM: 8.8 mg/dL — AB (ref 8.9–10.3)
CO2: 19 mmol/L — ABNORMAL LOW (ref 22–32)
Chloride: 108 mmol/L (ref 98–111)
Creatinine, Ser: 0.64 mg/dL (ref 0.44–1.00)
GFR calc Af Amer: >60 mL/min (ref >60)
GFR calc non Af Amer: >60 mL/min (ref >60)
Glucose, Bld: 111 mg/dL — ABNORMAL HIGH (ref 70–99)
Potassium: 3.5 mmol/L (ref 3.5–5.1)
Sodium: 135 mmol/L (ref 135–145)
Total Bilirubin: 0.7 mg/dL (ref 0.3–1.2)
Total Protein: 8.5 g/dL — ABNORMAL HIGH (ref 6.5–8.1)

## 2018-11-11 LAB — CBC
HCT: 26.6 % — ABNORMAL LOW (ref 36.0–46.0)
Hemoglobin: 6.7 g/dL — CL (ref 12.0–15.0)
MCH: 15.6 pg — ABNORMAL LOW (ref 26.0–34.0)
MCHC: 25.2 g/dL — ABNORMAL LOW (ref 30.0–36.0)
MCV: 62 fL — ABNORMAL LOW (ref 80.0–100.0)
Platelets: 480 10*3/uL — ABNORMAL HIGH (ref 150–400)
RBC: 4.29 MIL/uL (ref 3.87–5.11)
RDW: 22.1 % — ABNORMAL HIGH (ref 11.5–15.5)
WBC: 7.6 10*3/uL (ref 4.0–10.5)
nRBC: 0 % (ref 0.0–0.2)

## 2018-11-11 LAB — I-STAT BETA HCG BLOOD, ED (MC, WL, AP ONLY): I-stat hCG, quantitative: <5 m[IU]/mL (ref <5)

## 2018-11-11 LAB — LIPASE, BLOOD: Lipase: 28 U/L (ref 11–51)

## 2018-11-11 MED ORDER — ONDANSETRON HCL 4 MG/2ML IJ SOLN
4.0000 mg | Freq: Once | INTRAMUSCULAR | Status: AC
  Administered 2018-11-11: 4 mg via INTRAVENOUS
  Filled 2018-11-11: qty 2

## 2018-11-11 MED ORDER — SODIUM CHLORIDE 0.9 % IV SOLN
INTRAVENOUS | Status: DC
  Administered 2018-11-11: via INTRAVENOUS

## 2018-11-11 MED ORDER — SODIUM CHLORIDE 0.9 % IV BOLUS
1000.0000 mL | Freq: Once | INTRAVENOUS | Status: AC
  Administered 2018-11-11: 1000 mL via INTRAVENOUS

## 2018-11-11 MED ORDER — HYDROMORPHONE HCL 1 MG/ML IJ SOLN
1.0000 mg | Freq: Once | INTRAMUSCULAR | Status: AC
  Administered 2018-11-11: 1 mg via INTRAVENOUS
  Filled 2018-11-11: qty 1

## 2018-11-11 NOTE — ED Provider Notes (Signed)
[facility] EMERGENCY DEPARTMENT Provider Note   CSN: [id number] Arrival date & time: 11/11/18  2134    History   Chief Complaint Chief Complaint  Patient presents with  . Abdominal Pain    HPI Katie Middleton is a 33 y.o. female.     Patient presents with lower abdominal pain associated with nausea and vomiting.  States she started her period this morning after not having a menses for 2 months.  History of tubal ligation.  States she normally does not have a regular menses.  She has had severe crampy lower abdominal pain in her midline and left lower quadrant throughout the day today.  She had vomiting x2.  No diarrhea, pain with urination or blood in the urine.  States her vaginal bleeding is minimal.  Only other medical problem is anemia and has required transfusions in the past.  States she has not had this kind of pelvic pain before with her menses.  No back pain.  No chest pain or shortness of breath  The history is provided by the patient.  Abdominal Pain  Associated symptoms: nausea, vaginal bleeding and vomiting   Associated symptoms: no chest pain, no cough, no dysuria, no hematuria and no shortness of breath     Past Medical History:  Diagnosis Date  . Anemia     There are no active problems to display for this patient.   Past Surgical History:  Procedure Laterality Date  . CESAREAN SECTION    . TUBAL LIGATION       OB History   No obstetric history on file.      Home Medications    Prior to Admission medications   Not on File    Family History History reviewed. No pertinent family history.  Social History Social History   Tobacco Use  . Smoking status: Current Every Day Smoker    Types: Cigarettes  . Smokeless tobacco: Never Used  Substance Use Topics  . Alcohol use: Yes    Comment: occ  . Drug use: No     Allergies   Penicillins; Amoxicillin; and Bactroban [mupirocin calcium]   Review of Systems Review of Systems    Constitutional: Negative for activity change and appetite change.  HENT: Negative for congestion and sinus pain.   Eyes: Negative for visual disturbance.  Respiratory: Negative for cough and shortness of breath.   Cardiovascular: Negative for chest pain and leg swelling.  Gastrointestinal: Positive for abdominal pain, nausea and vomiting.  Genitourinary: Positive for vaginal bleeding. Negative for dysuria and hematuria.  Skin: Negative for rash.  Neurological: Negative for weakness, light-headedness and headaches.   all other systems are negative except as noted in the HPI and PMH.     Physical Exam Updated Vital Signs BP 98/64   Pulse 67   Temp 98.3 F (36.8 C) (Oral)   Resp 15   Ht 5\' 3"  (1.6 m) Comment: Simultaneous filing. User may not have seen previous data.  Wt 54.4 kg Comment: Simultaneous filing. User may not have seen previous data.  SpO2 99%   BMI 21.26 kg/m   Physical Exam Vitals signs and nursing note reviewed.  Constitutional:      General: She is not in acute distress.    Appearance: She is well-developed.     Comments: Pale conjunctiva  HENT:     Head: Normocephalic and atraumatic.     Mouth/Throat:     Pharynx: No oropharyngeal exudate.  Eyes:  Conjunctiva/sclera: Conjunctivae normal.     Pupils: Pupils are equal, round, and reactive to light.  Neck:     Musculoskeletal: Normal range of motion and neck supple.     Comments: No meningismus. Cardiovascular:     Rate and Rhythm: Normal rate and regular rhythm.     Heart sounds: Normal heart sounds. No murmur.  Pulmonary:     Effort: Pulmonary effort is normal. No respiratory distress.     Breath sounds: Normal breath sounds.  Abdominal:     Palpations: Abdomen is soft.     Tenderness: There is abdominal tenderness. There is no guarding or rebound.     Comments: Left lower quadrant and suprapubic tenderness No guarding or rebound  Genitourinary:    Comments: Chaperone present.  Normal external  genitalia.  There is dark blood in the vaginal vault without active bleeding.  There is midline uterine tenderness.  No CMT or adnexal tenderness Musculoskeletal: Normal range of motion.        General: No tenderness.     Comments: No CVA tenderness  Skin:    General: Skin is warm.     Capillary Refill: Capillary refill takes less than 2 seconds.  Neurological:     General: No focal deficit present.     Mental Status: She is alert and oriented to person, place, and time. Mental status is at baseline.     Cranial Nerves: No cranial nerve deficit.     Motor: No abnormal muscle tone.     Coordination: Coordination normal.     Comments: No ataxia on finger to nose bilaterally. No pronator drift. 5/5 strength throughout. CN 2-12 intact.Equal grip strength. Sensation intact.   Psychiatric:        Behavior: Behavior normal.      ED Treatments / Results  Labs (all labs ordered are listed, but only abnormal results are displayed) Labs Reviewed  COMPREHENSIVE METABOLIC PANEL - Abnormal; Notable for the following components:      Result Value   CO2 19 (*)    Glucose, Bld 111 (*)    Calcium 8.8 (*)    Total Protein 8.5 (*)    All other components within normal limits  CBC - Abnormal; Notable for the following components:   Hemoglobin 6.7 (*)    HCT 26.6 (*)    MCV 62.0 (*)    MCH 15.6 (*)    MCHC 25.2 (*)    RDW 22.1 (*)    Platelets 480 (*)    All other components within normal limits  URINALYSIS, ROUTINE W REFLEX MICROSCOPIC - Abnormal; Notable for the following components:   APPearance HAZY (*)    Hgb urine dipstick LARGE (*)    Ketones, ur 80 (*)    Bacteria, UA RARE (*)    All other components within normal limits  WET PREP, GENITAL  LIPASE, BLOOD  VITAMIN B12  FOLATE  IRON AND TIBC  FERRITIN  RETICULOCYTES  I-STAT BETA HCG BLOOD, ED ([facility], [facility], [facility] ONLY)  TYPE AND SCREEN  GC/CHLAMYDIA PROBE AMP ([place]) NOT AT [facility]    EKG None  Radiology US Transvaginal  Non-ob  Addendum Date: 11/12/2018   ADDENDUM REPORT: 11/12/2018 03:46 ADDENDUM: The patient return for additional transvaginal and Doppler imaging of the pelvis. Transvaginal images demonstrate heterogeneous hypoechoic material within the endometrium. Positioning and shape have changed since the transabdominal imaging. Cine images suggest flow in the endocervical canal. Appearance is most consistent with endometrial hemorrhage. Endometrial stripe thickness is  measured at 3.2 cm. Color and spectral Doppler images are obtained of both ovaries. Flow is demonstrated in both ovaries on color flow Doppler imaging. Arterial and venous waveform patterns are obtained bilaterally. No abnormal adnexal masses. Electronically Signed   By: Burman NievesWilliam  Stevens M.D.   On: 11/12/2018 03:46   Result Date: 11/12/2018 CLINICAL DATA:  Pelvic pain and vomiting. EXAM: TRANSABDOMINAL ULTRASOUND OF PELVIS TECHNIQUE: Transabdominal ultrasound examination of the pelvis was performed including evaluation of the uterus, ovaries, adnexal regions, and pelvic cul-de-sac. Patient refused additional transvaginal and Doppler imaging due to pain and vomiting. Transvaginal and Doppler studies were not performed. COMPARISON:  None. FINDINGS: Uterus Measurements: 12.1 x 5.2 x 5.8 cm. Uterus is anteverted and mildly retroflexed. No myometrial mass lesions are demonstrated. Endometrium Thickness: 1.9 cm. There is a 1.9 cm diameter fluid collection in the endometrium at the uterine fundus with somewhat heterogeneous endometrium inferiorly. By history, the pregnancy test is negative. This could represent endometrial infection, obstruction, hemorrhage, or cystic lesion. Retained failed pregnancy could be possible in the appropriate clinical setting. Gynecological consultation recommended. Right ovary Measurements: 3.7 x 2.9 x 2 cm. Small cyst, likely representing a functional cyst. No abnormal adnexal masses. Left ovary Measurements: 3.1 x 1.9 x 1.6 cm .  Normal appearance/no adnexal mass. Other findings:  No abnormal free fluid. IMPRESSION: Incomplete study as patient was not able to complete the transvaginal and Doppler imaging due to pain and vomiting. Large endometrial fluid collection. In the setting of a negative pregnancy test, this could represent endometrial infection, obstruction, hemorrhage, or cystic lesion. Gynecological consultation is suggested. No abnormal adnexal masses. Electronically Signed: By: Burman NievesWilliam  Stevens M.D. On: 11/12/2018 00:55   [place] Pelvis Complete  Addendum Date: 11/12/2018   ADDENDUM REPORT: 11/12/2018 03:46 ADDENDUM: The patient return for additional transvaginal and Doppler imaging of the pelvis. Transvaginal images demonstrate heterogeneous hypoechoic material within the endometrium. Positioning and shape have changed since the transabdominal imaging. Cine images suggest flow in the endocervical canal. Appearance is most consistent with endometrial hemorrhage. Endometrial stripe thickness is measured at 3.2 cm. Color and spectral Doppler images are obtained of both ovaries. Flow is demonstrated in both ovaries on color flow Doppler imaging. Arterial and venous waveform patterns are obtained bilaterally. No abnormal adnexal masses. Electronically Signed   By: Burman NievesWilliam  Stevens M.D.   On: 11/12/2018 03:46   Result Date: 11/12/2018 CLINICAL DATA:  Pelvic pain and vomiting. EXAM: TRANSABDOMINAL ULTRASOUND OF PELVIS TECHNIQUE: Transabdominal ultrasound examination of the pelvis was performed including evaluation of the uterus, ovaries, adnexal regions, and pelvic cul-de-sac. Patient refused additional transvaginal and Doppler imaging due to pain and vomiting. Transvaginal and Doppler studies were not performed. COMPARISON:  None. FINDINGS: Uterus Measurements: 12.1 x 5.2 x 5.8 cm. Uterus is anteverted and mildly retroflexed. No myometrial mass lesions are demonstrated. Endometrium Thickness: 1.9 cm. There is a 1.9 cm diameter fluid  collection in the endometrium at the uterine fundus with somewhat heterogeneous endometrium inferiorly. By history, the pregnancy test is negative. This could represent endometrial infection, obstruction, hemorrhage, or cystic lesion. Retained failed pregnancy could be possible in the appropriate clinical setting. Gynecological consultation recommended. Right ovary Measurements: 3.7 x 2.9 x 2 cm. Small cyst, likely representing a functional cyst. No abnormal adnexal masses. Left ovary Measurements: 3.1 x 1.9 x 1.6 cm . Normal appearance/no adnexal mass. Other findings:  No abnormal free fluid. IMPRESSION: Incomplete study as patient was not able to complete the transvaginal and Doppler imaging due to pain and  vomiting. Large endometrial fluid collection. In the setting of a negative pregnancy test, this could represent endometrial infection, obstruction, hemorrhage, or cystic lesion. Gynecological consultation is suggested. No abnormal adnexal masses. Electronically Signed: By: Burman Nieves M.D. On: 11/12/2018 00:55   [place] Art/ven Flow Abd Pelv Doppler  Addendum Date: 11/12/2018   ADDENDUM REPORT: 11/12/2018 03:46 ADDENDUM: The patient return for additional transvaginal and Doppler imaging of the pelvis. Transvaginal images demonstrate heterogeneous hypoechoic material within the endometrium. Positioning and shape have changed since the transabdominal imaging. Cine images suggest flow in the endocervical canal. Appearance is most consistent with endometrial hemorrhage. Endometrial stripe thickness is measured at 3.2 cm. Color and spectral Doppler images are obtained of both ovaries. Flow is demonstrated in both ovaries on color flow Doppler imaging. Arterial and venous waveform patterns are obtained bilaterally. No abnormal adnexal masses. Electronically Signed   By: Burman Nieves M.D.   On: 11/12/2018 03:46   Result Date: 11/12/2018 CLINICAL DATA:  Pelvic pain and vomiting. EXAM: TRANSABDOMINAL  ULTRASOUND OF PELVIS TECHNIQUE: Transabdominal ultrasound examination of the pelvis was performed including evaluation of the uterus, ovaries, adnexal regions, and pelvic cul-de-sac. Patient refused additional transvaginal and Doppler imaging due to pain and vomiting. Transvaginal and Doppler studies were not performed. COMPARISON:  None. FINDINGS: Uterus Measurements: 12.1 x 5.2 x 5.8 cm. Uterus is anteverted and mildly retroflexed. No myometrial mass lesions are demonstrated. Endometrium Thickness: 1.9 cm. There is a 1.9 cm diameter fluid collection in the endometrium at the uterine fundus with somewhat heterogeneous endometrium inferiorly. By history, the pregnancy test is negative. This could represent endometrial infection, obstruction, hemorrhage, or cystic lesion. Retained failed pregnancy could be possible in the appropriate clinical setting. Gynecological consultation recommended. Right ovary Measurements: 3.7 x 2.9 x 2 cm. Small cyst, likely representing a functional cyst. No abnormal adnexal masses. Left ovary Measurements: 3.1 x 1.9 x 1.6 cm . Normal appearance/no adnexal mass. Other findings:  No abnormal free fluid. IMPRESSION: Incomplete study as patient was not able to complete the transvaginal and Doppler imaging due to pain and vomiting. Large endometrial fluid collection. In the setting of a negative pregnancy test, this could represent endometrial infection, obstruction, hemorrhage, or cystic lesion. Gynecological consultation is suggested. No abnormal adnexal masses. Electronically Signed: By: Burman Nieves M.D. On: 11/12/2018 00:55    Procedures Procedures (including critical care time)  Medications Ordered in ED Medications  sodium chloride 0.9 % bolus 1,000 mL (has no administration in time range)  ondansetron (ZOFRAN) injection 4 mg (has no administration in time range)  HYDROmorphone (DILAUDID) injection 1 mg (has no administration in time range)  0.9 %  sodium chloride  infusion (has no administration in time range)     Initial Impression / Assessment and Plan / ED Course  I have reviewed the triage vital signs and the nursing notes.  Pertinent labs & imaging results that were available during my care of the patient were reviewed by me and considered in my medical decision making (see chart for details).       Pelvic pain with nausea and vomiting.  Found to be anemic at 6.7.  No recent values.  Patient's hemoglobin seems to be in the 5 and 6 range several years ago Patient states she has been transfused to 11 in the past. Was 8.6 2 years ago.  Patient refuses pelvic exam.  She allowed a partial pelvic ultrasound which shows a large endometrial fluid collection of undetermined etiology.  hCG is negative.  Differential includes infection, bleeding, hemorrhage, cystic lesion.  Discussed with Dr. Jolayne Pantheronstant of gynecology.  She states in the setting of a negative pregnancy test patient needs endometrial sampling as an outpatient.  Low suspicion for infection with no fever or leukocytosis.  Patient continues to refuse pelvic exam. Patient does have a gynecologist in [facility] Dr. Ferdinand CavaArus.   Patient more comfortable after receiving medications.  Will attempt again to do adnexal studies.  ADnexal study showed no evidence of ovarian torsion.  Patient did allow pelvic exam as above.  Low suspicion for infection without fever or leukocytosis.  Plan admission for blood transfusion patient will need outpatient gynecology follow-up regarding her ultrasound results.  Discussed with Dr. Antionette Charpyd.   CRITICAL CARE Performed by: Glynn OctaveStephen Niesha Bame Total critical care time: 33 minutes Critical care time was exclusive of separately billable procedures and treating other patients. Critical care was necessary to treat or prevent imminent or life-threatening deterioration. Critical care was time spent personally by me on the following activities: development of treatment plan with  patient and/or surrogate as well as nursing, discussions with consultants, evaluation of patient's response to treatment, examination of patient, obtaining history from patient or surrogate, ordering and performing treatments and interventions, ordering and review of laboratory studies, ordering and review of radiographic studies, pulse oximetry and re-evaluation of patient's condition.   Final Clinical Impressions(s) / ED Diagnoses   Final diagnoses:  Pelvic pain in female  Symptomatic anemia    ED Discharge Orders    None       Tamecka Milham, Jeannett SeniorStephen, MD 11/12/18 [id number]

## 2018-11-11 NOTE — ED Triage Notes (Addendum)
Pt BIB [facility] for eval of abd pain N/V x 14 hours. Pt reports no period x 2 mos and onset of menses this AM. +emesis w/ EMS. Reports LLQ abd pain in addition to vomiting.

## 2018-11-12 ENCOUNTER — Encounter (HOSPITAL_COMMUNITY): Payer: Self-pay | Admitting: Family Medicine

## 2018-11-12 DIAGNOSIS — D649 Anemia, unspecified: Secondary | ICD-10-CM | POA: Diagnosis not present

## 2018-11-12 DIAGNOSIS — D509 Iron deficiency anemia, unspecified: Secondary | ICD-10-CM | POA: Diagnosis present

## 2018-11-12 DIAGNOSIS — R109 Unspecified abdominal pain: Secondary | ICD-10-CM | POA: Diagnosis not present

## 2018-11-12 DIAGNOSIS — R1110 Vomiting, unspecified: Secondary | ICD-10-CM

## 2018-11-12 DIAGNOSIS — D500 Iron deficiency anemia secondary to blood loss (chronic): Secondary | ICD-10-CM | POA: Diagnosis not present

## 2018-11-12 DIAGNOSIS — N859 Noninflammatory disorder of uterus, unspecified: Secondary | ICD-10-CM | POA: Diagnosis not present

## 2018-11-12 LAB — BASIC METABOLIC PANEL
ANION GAP: 6 (ref 5–15)
BUN: 8 mg/dL (ref 6–20)
CALCIUM: 7.9 mg/dL — AB (ref 8.9–10.3)
CO2: 20 mmol/L — ABNORMAL LOW (ref 22–32)
Chloride: 110 mmol/L (ref 98–111)
Creatinine, Ser: 0.54 mg/dL (ref 0.44–1.00)
GFR calc Af Amer: >60 mL/min (ref >60)
GFR calc non Af Amer: >60 mL/min (ref >60)
Glucose, Bld: 97 mg/dL (ref 70–99)
Potassium: 3.6 mmol/L (ref 3.5–5.1)
Sodium: 136 mmol/L (ref 135–145)

## 2018-11-12 LAB — IRON AND TIBC
Iron: 15 ug/dL — ABNORMAL LOW (ref 28–170)
SATURATION RATIOS: 4 % — AB (ref 10.4–31.8)
TIBC: 351 ug/dL (ref 250–450)
UIBC: 336 ug/dL

## 2018-11-12 LAB — WET PREP, GENITAL
Clue Cells Wet Prep HPF POC: NONE SEEN
Sperm: NONE SEEN
Trich, Wet Prep: NONE SEEN
Yeast Wet Prep HPF POC: NONE SEEN

## 2018-11-12 LAB — PREPARE RBC (CROSSMATCH)

## 2018-11-12 LAB — RETICULOCYTES
Immature Retic Fract: 18.1 % — ABNORMAL HIGH (ref 2.3–15.9)
RBC.: 4.29 MIL/uL (ref 3.87–5.11)
Retic Count, Absolute: 58.8 10*3/uL (ref 19.0–186.0)
Retic Ct Pct: 1.4 % (ref 0.4–3.1)

## 2018-11-12 LAB — HIV ANTIBODY (ROUTINE TESTING W REFLEX): HIV Screen 4th Generation wRfx: NONREACTIVE

## 2018-11-12 LAB — FOLATE: Folate: 8.2 ng/mL (ref >5.9)

## 2018-11-12 LAB — VITAMIN B12: Vitamin B-12: 201 pg/mL (ref 180–914)

## 2018-11-12 LAB — FERRITIN: Ferritin: 6 ng/mL — ABNORMAL LOW (ref 11–307)

## 2018-11-12 MED ORDER — HYDROMORPHONE HCL 2 MG PO TABS: 2.0000 mg | ORAL_TABLET | Freq: Four times a day (QID) | ORAL | Status: DC | PRN

## 2018-11-12 MED ORDER — ONDANSETRON HCL 4 MG PO TABS: 4.0000 mg | ORAL_TABLET | Freq: Four times a day (QID) | ORAL | Status: DC | PRN

## 2018-11-12 MED ORDER — SODIUM CHLORIDE 0.9% IV SOLUTION: Freq: Once | INTRAVENOUS | Status: DC

## 2018-11-12 MED ORDER — TRAMADOL HCL 50 MG PO TABS: 50.0000 mg | ORAL_TABLET | Freq: Four times a day (QID) | ORAL | Status: DC | PRN

## 2018-11-12 MED ORDER — SODIUM CHLORIDE 0.9% FLUSH: 3.0000 mL | Freq: Two times a day (BID) | INTRAVENOUS | Status: DC

## 2018-11-12 MED ORDER — ACETAMINOPHEN 325 MG PO TABS: 650.0000 mg | ORAL_TABLET | Freq: Four times a day (QID) | ORAL | Status: DC | PRN

## 2018-11-12 MED ORDER — ACETAMINOPHEN 650 MG RE SUPP: 650.0000 mg | Freq: Four times a day (QID) | RECTAL | Status: DC | PRN

## 2018-11-12 MED ORDER — HYDROMORPHONE HCL 1 MG/ML IJ SOLN
1.0000 mg | Freq: Once | INTRAMUSCULAR | Status: AC
  Administered 2018-11-12: 1 mg via INTRAVENOUS
  Filled 2018-11-12: qty 1

## 2018-11-12 MED ORDER — SODIUM CHLORIDE 0.9 % IV SOLN
INTRAVENOUS | Status: DC
  Administered 2018-11-12: 05:00:00 via INTRAVENOUS

## 2018-11-12 MED ORDER — ONDANSETRON HCL 4 MG/2ML IJ SOLN: 4.0000 mg | Freq: Four times a day (QID) | INTRAMUSCULAR | Status: DC | PRN

## 2018-11-12 MED ORDER — SODIUM CHLORIDE 0.9 % IV SOLN
510.0000 mg | Freq: Once | INTRAVENOUS | Status: AC
  Administered 2018-11-12: 510 mg via INTRAVENOUS
  Filled 2018-11-12: qty 17

## 2018-11-12 MED ORDER — HYDROMORPHONE HCL 1 MG/ML IJ SOLN
1.0000 mg | INTRAMUSCULAR | Status: DC | PRN
  Filled 2018-11-12: qty 1

## 2018-11-12 NOTE — ED Notes (Signed)
ED Provider at bedside. 

## 2018-11-12 NOTE — Progress Notes (Signed)
Pt states she needs to be at work at [employer] today.

## 2018-11-12 NOTE — Progress Notes (Signed)
CM attempted to talk to patient- she wants a her paperwork sent to her Hematologist and a note for work; Patient is very demanding stating that she wants to leave now. RN made aware - pt signed out AMA

## 2018-11-12 NOTE — H&P (Signed)
History and Physical    Eller Gotts ZJI:[id number] DOB: [birth year] DOA: 11/11/2018  PCP: System, Pcp Not In   Patient coming from: Home   Chief Complaint: Lower abdominal pain, N/V   HPI: Katie Middleton is a 33 y.o. female with medical history significant for iron deficiency anemia, now presenting to the emergency department for evaluation of lower abdominal pain and nausea with vomiting.  Patient reports that she been in her usual state of health until 11/11/2018 when she developed acute onset of suprapubic pain with nausea and 2 episodes of nonbloody vomiting.  She denies any fevers or diarrhea.  She reports that she started menstruating yesterday.  She has chronic iron deficiency anemia and follows with hematology for management of this.  She had reported recent fatigue and lightheadedness earlier in the ED but denies that now.   ED Course: Upon arrival to the ED, patient is found to be afebrile, saturating well on room air, and with stable blood pressure.  Chemistry panel features a bicarbonate of 19 and CBC is notable for a microcytic anemia with hemoglobin of 6.7, down from 8.71-year earlier.  Pelvic ultrasound reveals a large endometrial fluid collection that could reflect an infection, obstruction, hemorrhage, or cystic lesion.  Patient was given a liter of normal saline, Zofran, and Dilaudid in the ED.  2 units of packed red blood cells were ordered for immediate transfusion.  Gynecology was consulted by the ED physician who advised that this is unlikely to be infection and recommended outpatient endometrial sampling.  Hospitalists are asked to admit.   Review of Systems:  All other systems reviewed and apart from HPI, are negative.  Past Medical History:  Diagnosis Date  . Anemia     Past Surgical History:  Procedure Laterality Date  . CESAREAN SECTION    . TUBAL LIGATION       reports that she has been smoking cigarettes. She has never used smokeless tobacco. She reports current  alcohol use. She reports that she does not use drugs.  Allergies  Allergen Reactions  . Penicillins Anaphylaxis    Did it involve swelling of the face/tongue/throat, SOB, or low BP? Unknown Did it involve sudden or severe rash/hives, skin peeling, or any reaction on the inside of your mouth or nose? Unknown Did you need to seek medical attention at a hospital or doctor's office? Unknown When did it last happen?at infancy If all above answers are "NO", may proceed with cephalosporin use.    Marland Kitchen Amoxicillin   . Bactroban [Mupirocin Calcium] Hives    History reviewed. No pertinent family history.   Prior to Admission medications   Not on File    Physical Exam: Vitals:   11/12/18 0130 11/12/18 0300 11/12/18 0315 11/12/18 0330  BP: (!) 97/52 (!) 92/53 (!) 101/55 101/62  Pulse: 63     Resp: 14     Temp:      TempSrc:      SpO2: 96%     Weight:      Height:        Constitutional: NAD, calm  Eyes: PERTLA, lids and conjunctivae normal ENMT: Mucous membranes are moist. Posterior pharynx clear of any exudate or lesions.   Neck: normal, supple, no masses, no thyromegaly Respiratory: clear to auscultation bilaterally, no wheezing, no crackles. Normal respiratory effort.   Cardiovascular: S1 & S2 heard, regular rate and rhythm. No extremity edema.   Abdomen: No distension, no tenderness, soft. Bowel sounds normal.  Musculoskeletal: no clubbing / cyanosis.  No joint deformity upper and lower extremities.   Skin: no significant rashes, lesions, ulcers. Warm, dry, well-perfused. Neurologic: CN 2-12 grossly intact. Sensation intact. Strength 5/5 in all 4 limbs.  Psychiatric: Alert and oriented x 3. Calm.    Labs on Admission: I have personally reviewed following labs and imaging studies  CBC: Recent Labs  Lab 11/11/18 2142  WBC 7.6  HGB 6.7*  HCT 26.6*  MCV 62.0*  PLT 480*   Basic Metabolic Panel: Recent Labs  Lab 11/11/18 2142  NA 135  K 3.5  CL 108  CO2 19*    GLUCOSE 111*  BUN 8  CREATININE 0.64  CALCIUM 8.8*   GFR: Estimated Creatinine Clearance: 83.5 mL/min (by C-G formula based on SCr of 0.64 mg/dL). Liver Function Tests: Recent Labs  Lab 11/11/18 2142  AST 20  ALT 9  ALKPHOS 63  BILITOT 0.7  PROT 8.5*  ALBUMIN 3.7   Recent Labs  Lab 11/11/18 2142  LIPASE 28   No results for input(s): AMMONIA in the last 168 hours. Coagulation Profile: No results for input(s): INR, PROTIME in the last 168 hours. Cardiac Enzymes: No results for input(s): CKTOTAL, CKMB, CKMBINDEX, TROPONINI in the last 168 hours. BNP (last 3 results) No results for input(s): PROBNP in the last 8760 hours. HbA1C: No results for input(s): HGBA1C in the last 72 hours. CBG: No results for input(s): GLUCAP in the last 168 hours. Lipid Profile: No results for input(s): CHOL, HDL, LDLCALC, TRIG, CHOLHDL, LDLDIRECT in the last 72 hours. Thyroid Function Tests: No results for input(s): TSH, T4TOTAL, FREET4, T3FREE, THYROIDAB in the last 72 hours. Anemia Panel: Recent Labs    11/11/18 2359  VITAMINB12 201  FOLATE 8.2  FERRITIN 6*  TIBC 351  IRON 15*  RETICCTPCT 1.4   Urine analysis:    Component Value Date/Time   COLORURINE YELLOW 11/11/2018 2139   APPEARANCEUR HAZY (A) 11/11/2018 2139   LABSPEC 1.026 11/11/2018 2139   PHURINE 6.0 11/11/2018 2139   GLUCOSEU NEGATIVE 11/11/2018 2139   HGBUR LARGE (A) 11/11/2018 2139   BILIRUBINUR NEGATIVE 11/11/2018 2139   KETONESUR 80 (A) 11/11/2018 2139   PROTEINUR NEGATIVE 11/11/2018 2139   UROBILINOGEN 1.0 04/05/2014 0841   NITRITE NEGATIVE 11/11/2018 2139   LEUKOCYTESUR NEGATIVE 11/11/2018 2139   Sepsis Labs: @LABRCNTIP (procalcitonin:4,lacticidven:4) ) Recent Results (from the past 240 hour(s))  Wet prep, genital     Status: Abnormal   Collection Time: 11/12/18  1:42 AM  Result Value Ref Range Status   Yeast Wet Prep HPF POC NONE SEEN NONE SEEN Final   Trich, Wet Prep NONE SEEN NONE SEEN Final   Clue  Cells Wet Prep HPF POC NONE SEEN NONE SEEN Final   WBC, Wet Prep HPF POC FEW (A) NONE SEEN Final   Sperm NONE SEEN  Final    Comment: Performed at [facility], [address] [place], [place] [id number]     Radiological Exams on Admission: Koreas Transvaginal Non-ob  Addendum Date: 11/12/2018   ADDENDUM REPORT: 11/12/2018 03:46 ADDENDUM: The patient return for additional transvaginal and Doppler imaging of the pelvis. Transvaginal images demonstrate heterogeneous hypoechoic material within the endometrium. Positioning and shape have changed since the transabdominal imaging. Cine images suggest flow in the endocervical canal. Appearance is most consistent with endometrial hemorrhage. Endometrial stripe thickness is measured at 3.2 cm. Color and spectral Doppler images are obtained of both ovaries. Flow is demonstrated in both ovaries on color flow Doppler imaging. Arterial and venous waveform  patterns are obtained bilaterally. No abnormal adnexal masses. Electronically Signed   By: Burman NievesWilliam  Stevens M.D.   On: 11/12/2018 03:46   Result Date: 11/12/2018 CLINICAL DATA:  Pelvic pain and vomiting. EXAM: TRANSABDOMINAL ULTRASOUND OF PELVIS TECHNIQUE: Transabdominal ultrasound examination of the pelvis was performed including evaluation of the uterus, ovaries, adnexal regions, and pelvic cul-de-sac. Patient refused additional transvaginal and Doppler imaging due to pain and vomiting. Transvaginal and Doppler studies were not performed. COMPARISON:  None. FINDINGS: Uterus Measurements: 12.1 x 5.2 x 5.8 cm. Uterus is anteverted and mildly retroflexed. No myometrial mass lesions are demonstrated. Endometrium Thickness: 1.9 cm. There is a 1.9 cm diameter fluid collection in the endometrium at the uterine fundus with somewhat heterogeneous endometrium inferiorly. By history, the pregnancy test is negative. This could represent endometrial infection, obstruction, hemorrhage, or cystic lesion. Retained failed  pregnancy could be possible in the appropriate clinical setting. Gynecological consultation recommended. Right ovary Measurements: 3.7 x 2.9 x 2 cm. Small cyst, likely representing a functional cyst. No abnormal adnexal masses. Left ovary Measurements: 3.1 x 1.9 x 1.6 cm . Normal appearance/no adnexal mass. Other findings:  No abnormal free fluid. IMPRESSION: Incomplete study as patient was not able to complete the transvaginal and Doppler imaging due to pain and vomiting. Large endometrial fluid collection. In the setting of a negative pregnancy test, this could represent endometrial infection, obstruction, hemorrhage, or cystic lesion. Gynecological consultation is suggested. No abnormal adnexal masses. Electronically Signed: By: Burman NievesWilliam  Stevens M.D. On: 11/12/2018 00:55   [place]oreas Pelvis Complete  Addendum Date: 11/12/2018   ADDENDUM REPORT: 11/12/2018 03:46 ADDENDUM: The patient return for additional transvaginal and Doppler imaging of the pelvis. Transvaginal images demonstrate heterogeneous hypoechoic material within the endometrium. Positioning and shape have changed since the transabdominal imaging. Cine images suggest flow in the endocervical canal. Appearance is most consistent with endometrial hemorrhage. Endometrial stripe thickness is measured at 3.2 cm. Color and spectral Doppler images are obtained of both ovaries. Flow is demonstrated in both ovaries on color flow Doppler imaging. Arterial and venous waveform patterns are obtained bilaterally. No abnormal adnexal masses. Electronically Signed   By: Burman NievesWilliam  Stevens M.D.   On: 11/12/2018 03:46   Result Date: 11/12/2018 CLINICAL DATA:  Pelvic pain and vomiting. EXAM: TRANSABDOMINAL ULTRASOUND OF PELVIS TECHNIQUE: Transabdominal ultrasound examination of the pelvis was performed including evaluation of the uterus, ovaries, adnexal regions, and pelvic cul-de-sac. Patient refused additional transvaginal and Doppler imaging due to pain and vomiting.  Transvaginal and Doppler studies were not performed. COMPARISON:  None. FINDINGS: Uterus Measurements: 12.1 x 5.2 x 5.8 cm. Uterus is anteverted and mildly retroflexed. No myometrial mass lesions are demonstrated. Endometrium Thickness: 1.9 cm. There is a 1.9 cm diameter fluid collection in the endometrium at the uterine fundus with somewhat heterogeneous endometrium inferiorly. By history, the pregnancy test is negative. This could represent endometrial infection, obstruction, hemorrhage, or cystic lesion. Retained failed pregnancy could be possible in the appropriate clinical setting. Gynecological consultation recommended. Right ovary Measurements: 3.7 x 2.9 x 2 cm. Small cyst, likely representing a functional cyst. No abnormal adnexal masses. Left ovary Measurements: 3.1 x 1.9 x 1.6 cm . Normal appearance/no adnexal mass. Other findings:  No abnormal free fluid. IMPRESSION: Incomplete study as patient was not able to complete the transvaginal and Doppler imaging due to pain and vomiting. Large endometrial fluid collection. In the setting of a negative pregnancy test, this could represent endometrial infection, obstruction, hemorrhage, or cystic lesion. Gynecological consultation is suggested. No abnormal  adnexal masses. Electronically Signed: By: Burman Nieves M.D. On: 11/12/2018 00:55   [place] Art/ven Flow Abd Pelv Doppler  Addendum Date: 11/12/2018   ADDENDUM REPORT: 11/12/2018 03:46 ADDENDUM: The patient return for additional transvaginal and Doppler imaging of the pelvis. Transvaginal images demonstrate heterogeneous hypoechoic material within the endometrium. Positioning and shape have changed since the transabdominal imaging. Cine images suggest flow in the endocervical canal. Appearance is most consistent with endometrial hemorrhage. Endometrial stripe thickness is measured at 3.2 cm. Color and spectral Doppler images are obtained of both ovaries. Flow is demonstrated in both ovaries on color flow  Doppler imaging. Arterial and venous waveform patterns are obtained bilaterally. No abnormal adnexal masses. Electronically Signed   By: Burman Nieves M.D.   On: 11/12/2018 03:46   Result Date: 11/12/2018 CLINICAL DATA:  Pelvic pain and vomiting. EXAM: TRANSABDOMINAL ULTRASOUND OF PELVIS TECHNIQUE: Transabdominal ultrasound examination of the pelvis was performed including evaluation of the uterus, ovaries, adnexal regions, and pelvic cul-de-sac. Patient refused additional transvaginal and Doppler imaging due to pain and vomiting. Transvaginal and Doppler studies were not performed. COMPARISON:  None. FINDINGS: Uterus Measurements: 12.1 x 5.2 x 5.8 cm. Uterus is anteverted and mildly retroflexed. No myometrial mass lesions are demonstrated. Endometrium Thickness: 1.9 cm. There is a 1.9 cm diameter fluid collection in the endometrium at the uterine fundus with somewhat heterogeneous endometrium inferiorly. By history, the pregnancy test is negative. This could represent endometrial infection, obstruction, hemorrhage, or cystic lesion. Retained failed pregnancy could be possible in the appropriate clinical setting. Gynecological consultation recommended. Right ovary Measurements: 3.7 x 2.9 x 2 cm. Small cyst, likely representing a functional cyst. No abnormal adnexal masses. Left ovary Measurements: 3.1 x 1.9 x 1.6 cm . Normal appearance/no adnexal mass. Other findings:  No abnormal free fluid. IMPRESSION: Incomplete study as patient was not able to complete the transvaginal and Doppler imaging due to pain and vomiting. Large endometrial fluid collection. In the setting of a negative pregnancy test, this could represent endometrial infection, obstruction, hemorrhage, or cystic lesion. Gynecological consultation is suggested. No abnormal adnexal masses. Electronically Signed: By: Burman Nieves M.D. On: 11/12/2018 00:55    EKG: Not performed.   Assessment/Plan   1. Symptomatic anemia; iron-deficiency  anemia  - Presents with abdominal pain and N/V and found to have Hgb of 6.7  - She follows with [facility] hematologist for management of chronic IDA  - Hgb is 6.7, down from 8.7 a year earlier  - Likely secondary to menstruation  - 2 units RBC were ordered from ED  - Check post-transfusion H&H, continue outpatient hematology and gynecology follow-up    2. Abdominal pain with N/V  - Exam is benign; pain and nausea resolved with analgesic in ED and has not returned  - Continue supportive care    3. Endometrial fluid collection  - Gynecology consulted by ED physician, feels infection unlikely, and recommends outpatient endometrial sampling  - She will follow-up with her gynecologist     DVT prophylaxis: SCD's Code Status: Full  Family Communication: Discussed with patient  Consults called: Gynecology consulted by ED physician  Admission status: Observation    Briscoe Deutscher, MD [facility] Pager [phone]  If 7PM-7AM, please contact night-coverage [email or web address] Password [facility]  11/12/2018, 3:58 AM

## 2018-11-12 NOTE — Progress Notes (Signed)
Nurse went to give patient her pain medicine.  Patient came out of the bathroom and stated "I need to get out of here. You are not doing anything to address my problem.  I have not even seen the doctor.  I am here messing my clothes".  Patient is on her menstrual period.  Staff provided patient with pads and disposable underwear..Patient has iron infusing. Patient stated she can get iron infusion from her doctor's office.  This nurse informed patient that blood bank notified  RN that patient's blood is ready.  Patient stated " I don't know. It is not safe."  Patient refused her pain medicine.  This nurse notified attending MD of patient wanting to leave AMA via amion.  Elnita Maxwell, RN

## 2018-11-12 NOTE — ED Notes (Signed)
Patient transported to Ultrasound 

## 2018-11-12 NOTE — ED Notes (Signed)
Pt refused pelvic exam. MD notified

## 2018-11-12 NOTE — Discharge Summary (Signed)
Patient signed out AMA prior to receiving blood transfusions.     Noralee Stain, DO [facility] [email or web address] 11/12/2018, 10:55 AM

## 2018-11-13 LAB — GC/CHLAMYDIA PROBE AMP (~~LOC~~) NOT AT ARMC
Chlamydia: NEGATIVE
NEISSERIA GONORRHEA: NEGATIVE

## 2018-11-16 LAB — TYPE AND SCREEN
ABO/RH(D): O POS
Antibody Screen: POSITIVE
DAT, IgG: POSITIVE
UNIT DIVISION: 0
Unit division: 0

## 2018-11-16 LAB — BPAM RBC
Blood Product Expiration Date: 202003232359
Blood Product Expiration Date: 202003232359
Unit Type and Rh: 5100
Unit Type and Rh: 5100

## 2018-12-25 ENCOUNTER — Other Ambulatory Visit: Payer: Self-pay

## 2018-12-25 ENCOUNTER — Encounter (HOSPITAL_BASED_OUTPATIENT_CLINIC_OR_DEPARTMENT_OTHER): Payer: Self-pay | Admitting: *Deleted

## 2018-12-25 ENCOUNTER — Emergency Department (HOSPITAL_BASED_OUTPATIENT_CLINIC_OR_DEPARTMENT_OTHER)
Admission: EM | Admit: 2018-12-25 | Discharge: 2018-12-25 | Disposition: A | Discharge status: Home / Self Care | Payer: Medicaid Other | Attending: Emergency Medicine | Admitting: Emergency Medicine

## 2018-12-25 DIAGNOSIS — E871 Hypo-osmolality and hyponatremia: Secondary | ICD-10-CM | POA: Diagnosis not present

## 2018-12-25 DIAGNOSIS — Z87891 Personal history of nicotine dependence: Secondary | ICD-10-CM | POA: Insufficient documentation

## 2018-12-25 DIAGNOSIS — E860 Dehydration: Secondary | ICD-10-CM | POA: Insufficient documentation

## 2018-12-25 DIAGNOSIS — M791 Myalgia, unspecified site: Secondary | ICD-10-CM | POA: Diagnosis not present

## 2018-12-25 DIAGNOSIS — D649 Anemia, unspecified: Secondary | ICD-10-CM | POA: Diagnosis not present

## 2018-12-25 DIAGNOSIS — R531 Weakness: Secondary | ICD-10-CM | POA: Diagnosis present

## 2018-12-25 HISTORY — DX: Lyme disease, unspecified: A69.20

## 2018-12-25 LAB — CBC WITH DIFFERENTIAL/PLATELET
Abs Immature Granulocytes: 0.02 10*3/uL (ref 0.00–0.07)
Basophils Absolute: 0 10*3/uL (ref 0.0–0.1)
Basophils Relative: 0 %
Eosinophils Absolute: 0.3 10*3/uL (ref 0.0–0.5)
Eosinophils Relative: 5 %
HCT: 32.1 % — ABNORMAL LOW (ref 36.0–46.0)
Hemoglobin: 9.8 g/dL — ABNORMAL LOW (ref 12.0–15.0)
Immature Granulocytes: 0 %
Lymphocytes Relative: 16 %
Lymphs Abs: 1 10*3/uL (ref 0.7–4.0)
MCH: 25.1 pg — ABNORMAL LOW (ref 26.0–34.0)
MCHC: 30.5 g/dL (ref 30.0–36.0)
MCV: 82.1 fL (ref 80.0–100.0)
Monocytes Absolute: 0.1 10*3/uL (ref 0.1–1.0)
Monocytes Relative: 2 %
Neutro Abs: 4.8 10*3/uL (ref 1.7–7.7)
Neutrophils Relative %: 77 %
Platelets: 333 10*3/uL (ref 150–400)
RBC: 3.91 MIL/uL (ref 3.87–5.11)
Smear Review: NORMAL
WBC: 6.2 10*3/uL (ref 4.0–10.5)
nRBC: 0 % (ref 0.0–0.2)

## 2018-12-25 LAB — URINALYSIS, ROUTINE W REFLEX MICROSCOPIC
Bilirubin Urine: NEGATIVE
Glucose, UA: NEGATIVE mg/dL
Hgb urine dipstick: NEGATIVE
Ketones, ur: NEGATIVE mg/dL
Leukocytes,Ua: NEGATIVE
Nitrite: NEGATIVE
Protein, ur: NEGATIVE mg/dL
Specific Gravity, Urine: 1.02 (ref 1.005–1.030)
pH: 6 (ref 5.0–8.0)

## 2018-12-25 LAB — COMPREHENSIVE METABOLIC PANEL
ALT: 30 U/L (ref 0–44)
AST: 45 U/L — ABNORMAL HIGH (ref 15–41)
Albumin: 3.2 g/dL — ABNORMAL LOW (ref 3.5–5.0)
Alkaline Phosphatase: 64 U/L (ref 38–126)
Anion gap: 5 (ref 5–15)
BUN: 10 mg/dL (ref 6–20)
CO2: 22 mmol/L (ref 22–32)
Calcium: 8.2 mg/dL — ABNORMAL LOW (ref 8.9–10.3)
Chloride: 102 mmol/L (ref 98–111)
Creatinine, Ser: 0.56 mg/dL (ref 0.44–1.00)
GFR calc Af Amer: >60 mL/min (ref >60)
GFR calc non Af Amer: >60 mL/min (ref >60)
Glucose, Bld: 96 mg/dL (ref 70–99)
Potassium: 3.5 mmol/L (ref 3.5–5.1)
Sodium: 129 mmol/L — ABNORMAL LOW (ref 135–145)
Total Bilirubin: 0.2 mg/dL — ABNORMAL LOW (ref 0.3–1.2)
Total Protein: 7.9 g/dL (ref 6.5–8.1)

## 2018-12-25 LAB — MAGNESIUM: Magnesium: 1.7 mg/dL (ref 1.7–2.4)

## 2018-12-25 LAB — PREGNANCY, URINE: Preg Test, Ur: NEGATIVE

## 2018-12-25 LAB — CK: Total CK: 17 U/L — ABNORMAL LOW (ref 38–234)

## 2018-12-25 NOTE — Discharge Instructions (Addendum)
You may alternate Tylenol 1000 mg every 6 hours as needed for pain and Ibuprofen 800 mg every 8 hours as needed for pain.  Please take Ibuprofen with food.   I recommend that you have your sodium level rechecked in the next 1 to 2 weeks.  It was slightly low today at 129.    Steps to find a Primary Care Provider (PCP):  Call [phone] or [phone] to access "[place] Find a Doctor Service."  2.  You may also go on the [facility] website at [email or web address]  3.  [facility] also frequently accepts new patients.  [facility]  [address] [place] [address] [phone]  4.  There are also multiple [facility], [facility] and [facility] practices throughout the [place] that are frequently accepting new patients. You may find a clinic that is close to your home and contact them.  [facility] [email or web address] [phone]  [facility] [email or web address]  [email or web address] [phone]  [facility] [email or web address] [phone]  5.  Local Health Departments also can provide primary care services.  [facility]  [address] [place] [address] [phone]  [facility] Department [address] [place] [address] [phone]  [facility] Health Department [address] [place] [address]  [place] [address] [phone]

## 2018-12-25 NOTE — ED Notes (Signed)
PT states understanding of care given, follow up care. PT ambulated from ED to car with a steady gait.  

## 2018-12-25 NOTE — ED Provider Notes (Signed)
TIME SEEN: 12:20 AM  CHIEF COMPLAINT: Muscle aches  HPI: Patient is a 33 year old female with history of anemia requiring previous blood transfusion, diagnosis of Lyme infection 2 to 3 years ago that was treated with doxycycline who presents to the emergency department with muscle aches.  States that she has had discomfort and weakness in both arms and legs, mostly in the thighs and upper arms for the past month.  States that she has felt like she cannot get up and do the things that she wants quickly.  No injury that she can recall.  She denies feeling fatigued when she has in the past with her anemia.  No redness or warmth of her joints or swelling of her extremities.  No numbness or focal weakness.  She denies fevers, chills, cough, chest pain, shortness of breath, abdominal pain, vomiting, diarrhea.  She is adopted and does not know her biologic family history.  She is not sure if there is any history of autoimmune disease.  She denies any recent tick bites.  ROS: See HPI Constitutional: no fever  Eyes: no drainage  ENT: no runny nose   Cardiovascular:  no chest pain  Resp: no SOB  GI: no vomiting GU: no dysuria Integumentary: no rash  Allergy: no hives  Musculoskeletal: no leg swelling  Neurological: no slurred speech ROS otherwise negative  PAST MEDICAL HISTORY/PAST SURGICAL HISTORY:  Past Medical History:  Diagnosis Date  . Anemia     MEDICATIONS:  Prior to Admission medications   Not on File    ALLERGIES:  Allergies  Allergen Reactions  . Penicillins Anaphylaxis    Did it involve swelling of the face/tongue/throat, SOB, or low BP? Unknown Did it involve sudden or severe rash/hives, skin peeling, or any reaction on the inside of your mouth or nose? Unknown Did you need to seek medical attention at a hospital or doctor's office? Unknown When did it last happen?at infancy If all above answers are "NO", may proceed with cephalosporin use.    Marland Kitchen Amoxicillin   .  Bactroban [Mupirocin Calcium] Hives    SOCIAL HISTORY:  Social History   Tobacco Use  . Smoking status: Former Smoker    Types: Cigarettes    Last attempt to quit: 12/15/2018    Years since quitting: 0.0  . Smokeless tobacco: Never Used  Substance Use Topics  . Alcohol use: Yes    Comment: occ    FAMILY HISTORY: History reviewed. No pertinent family history.  EXAM: BP 104/61   Pulse 96   Temp 99.1 F (37.3 C) (Oral)   Resp 18   Ht 5\' 3"  (1.6 m)   Wt 59 kg   LMP 12/18/2018   SpO2 100%   BMI 23.03 kg/m  CONSTITUTIONAL: Alert and oriented and responds appropriately to questions. Well-appearing; well-nourished HEAD: Normocephalic EYES: Conjunctivae clear, pupils appear equal, EOMI ENT: normal nose; moist mucous membranes NECK: Supple, no meningismus, no nuchal rigidity, no LAD  CARD: RRR; S1 and S2 appreciated; no murmurs, no clicks, no rubs, no gallops RESP: Normal chest excursion without splinting or tachypnea; breath sounds clear and equal bilaterally; no wheezes, no rhonchi, no rales, no hypoxia or respiratory distress, speaking full sentences ABD/GI: Normal bowel sounds; non-distended; soft, non-tender, no rebound, no guarding, no peritoneal signs, no hepatosplenomegaly BACK:  The back appears normal and is non-tender to palpation, there is no CVA tenderness EXT: Normal ROM in all joints; non-tender to palpation; no edema; normal capillary refill; no cyanosis, no calf tenderness  or swelling, no joint effusion noted, no redness or warmth, extremities show no deformity, compartments are soft, 2+ radial and DP pulses bilaterally    SKIN: Normal color for age and race; warm; no rash NEURO: Moves all extremities equally, sensation to light touch is intact diffusely, strength 5/5 in all 4 extremities, normal gait, no facial asymmetry, normal speech PSYCH: The patient's mood and manner are appropriate. Grooming and personal hygiene are appropriate.  MEDICAL DECISION MAKING:  Patient here with generalized muscle weakness and pain for the past month.  Differential is large and includes myositis, viral illness, dehydration, electrolyte abnormality, anemia, autoimmune disorder such as polymyalgia rheumatica.  Will obtain labs, urine today.  No signs of compartment syndrome, fracture or dislocation, gout, septic arthritis, DVT, arterial obstruction.  Doubt stroke.  She declines any medication for discomfort at this time.  She does not have a PCP.  ED PROGRESS: Patient's labs unremarkable other than a sodium level of 129, hemoglobin of 9.8 but this has improved compared to previous.  She has a very mildly elevated AST but otherwise LFTs normal.  CK level normal.  Urine shows no sign of myoglobinuria, dehydration.  She is not pregnant.  Other electrolytes within normal limits.  I feel she is safe to be discharged and follow-up closely with her primary care doctor for further testing and for possible referral to rheumatology.  She has no obvious weakness on exam but complains of feeling like her large muscle groups are fatigued and uncomfortable.  We did discuss the possibility of autoimmune disorder such as polymyalgia rheumatica.  I have low suspicion that this is MS, myasthenia gravis.  She is able to ambulate in the emergency department without difficulty.  I feel she is safe to be discharged home.  I have recommended that she have her sodium level rechecked in the next 1 to 2 weeks.  She is not on a diuretic.  She states that she does drink a large amount of water every day which may be contributing to her hyponatremia.  She does not drink beer regularly.  She appears euvolemic on exam.  We have discussed return precautions.  Patient verbalized understanding and is comfortable with this plan.  She has no recent infectious symptoms.  At this time I am not concerned for COVID-19.   At this time, I do not feel there is any life-threatening condition present. I have reviewed and discussed  all results (EKG, imaging, lab, urine as appropriate) and exam findings with patient/family. I have reviewed nursing notes and appropriate previous records.  I feel the patient is safe to be discharged home without further emergent workup and can continue workup as an outpatient as needed. Discussed usual and customary return precautions. Patient/family verbalize understanding and are comfortable with this plan.  Outpatient follow-up has been provided as needed. All questions have been answered.      Shamyra Farias, Layla Maw, DO 12/25/18 [phone]

## 2018-12-25 NOTE — ED Triage Notes (Signed)
Pt c/o generalized body aches x 1 month

## 2019-01-06 ENCOUNTER — Encounter (HOSPITAL_COMMUNITY): Payer: Self-pay

## 2019-01-06 ENCOUNTER — Emergency Department (HOSPITAL_COMMUNITY)
Admission: EM | Admit: 2019-01-06 | Discharge: 2019-01-06 | Disposition: A | Discharge status: Home / Self Care | Payer: Managed Care, Other (non HMO) | Attending: Emergency Medicine | Admitting: Emergency Medicine

## 2019-01-06 ENCOUNTER — Other Ambulatory Visit: Payer: Self-pay

## 2019-01-06 DIAGNOSIS — M25541 Pain in joints of right hand: Secondary | ICD-10-CM | POA: Diagnosis present

## 2019-01-06 DIAGNOSIS — Z87891 Personal history of nicotine dependence: Secondary | ICD-10-CM | POA: Diagnosis not present

## 2019-01-06 DIAGNOSIS — M2550 Pain in unspecified joint: Secondary | ICD-10-CM | POA: Diagnosis not present

## 2019-01-06 MED ORDER — PREDNISONE 10 MG (21) PO TBPK: ORAL_TABLET | Freq: Every day | ORAL | 0 refills | Status: DC

## 2019-01-06 NOTE — ED Triage Notes (Signed)
Pt states that her fingers started swelling 2 weeks ago. Pt states that she was see at [facility]. Pt states there is swelling in her wrist and ankles.  Pt states was dx with fibro and attempted to call dr to fu, but was unable to.

## 2019-01-06 NOTE — ED Provider Notes (Signed)
[facility]-EMERGENCY DEPT Provider Note   CSN: [id number] Arrival date & time: 01/06/19  1156    History   Chief Complaint Chief Complaint  Patient presents with  . Joint Swelling    HPI Katie Middleton is a 33 y.o. female.     33 year old female presents with 1 week history of swelling to her joints.  Discomfort localized to her knuckles of her hands as well as her ankle and left shoulder.  Denies any fever or chills.  Seen at [facility] for similar symptoms 12 days ago and that blood work was reviewed.  No recent history of injury.  Has tried again to see a primary care doctor without relief.     Past Medical History:  Diagnosis Date  . Anemia   . Lyme disease     Patient Active Problem List   Diagnosis Date Noted  . Symptomatic anemia 11/12/2018  . Iron deficiency anemia 11/12/2018  . Abdominal pain with vomiting 11/12/2018  . Fluid in endometrial cavity 11/12/2018    Past Surgical History:  Procedure Laterality Date  . CESAREAN SECTION    . TUBAL LIGATION       OB History   No obstetric history on file.      Home Medications    Prior to Admission medications   Not on File    Family History No family history on file.  Social History Social History   Tobacco Use  . Smoking status: Former Smoker    Types: Cigarettes    Last attempt to quit: 12/15/2018    Years since quitting: 0.0  . Smokeless tobacco: Never Used  Substance Use Topics  . Alcohol use: Yes    Comment: occ  . Drug use: No     Allergies   Penicillins; Amoxicillin; and Bactroban [mupirocin calcium]   Review of Systems Review of Systems  All other systems reviewed and are negative.    Physical Exam Updated Vital Signs BP 108/60 (BP Location: Left Arm)   Pulse 84   Temp 98.5 F (36.9 C) (Oral)   Resp 13   Ht 1.6 m (5\' 3" )   Wt 59 kg   LMP 12/18/2018   SpO2 100%   BMI 23.04 kg/m   Physical Exam Vitals signs and nursing note  reviewed.  Constitutional:      General: She is not in acute distress.    Appearance: Normal appearance. She is well-developed. She is not toxic-appearing.  HENT:     Head: Normocephalic and atraumatic.  Eyes:     General: Lids are normal.     Conjunctiva/sclera: Conjunctivae normal.     Pupils: Pupils are equal, round, and reactive to light.  Neck:     Musculoskeletal: Normal range of motion and neck supple.     Thyroid: No thyroid mass.     Trachea: No tracheal deviation.  Cardiovascular:     Rate and Rhythm: Normal rate and regular rhythm.     Heart sounds: Normal heart sounds. No murmur. No gallop.   Pulmonary:     Effort: Pulmonary effort is normal. No respiratory distress.     Breath sounds: Normal breath sounds. No stridor. No decreased breath sounds, wheezing, rhonchi or rales.  Abdominal:     General: Bowel sounds are normal. There is no distension.     Palpations: Abdomen is soft.     Tenderness: There is no abdominal tenderness. There is no rebound.  Musculoskeletal: Normal range of motion.  General: No tenderness.     Comments: Full range of motion of all of her joints.  No evidence of septic joint.  Does have tenderness at the knuckles of her hands.  Joints are non-warm to palpation.  Skin:    General: Skin is warm and dry.     Findings: No abrasion or rash.  Neurological:     Mental Status: She is alert and oriented to person, place, and time.     GCS: GCS eye subscore is 4. GCS verbal subscore is 5. GCS motor subscore is 6.     Cranial Nerves: No cranial nerve deficit.     Sensory: No sensory deficit.  Psychiatric:        Speech: Speech normal.        Behavior: Behavior normal.      ED Treatments / Results  Labs (all labs ordered are listed, but only abnormal results are displayed) Labs Reviewed - No data to display  EKG None  Radiology No results found.  Procedures Procedures (including critical care time)  Medications Ordered in ED  Medications - No data to display   Initial Impression / Assessment and Plan / ED Course  I have reviewed the triage vital signs and the nursing notes.  Pertinent labs & imaging results that were available during my care of the patient were reviewed by me and considered in my medical decision making (see chart for details).        Suspect that patient has some component of an autoimmune disorder.  Will place on prednisone and she will follow-up with a rheumatologist of her choice  Final Clinical Impressions(s) / ED Diagnoses   Final diagnoses:  None    ED Discharge Orders    None       Lorre Nick, MD 01/06/19 1228

## 2019-01-25 ENCOUNTER — Other Ambulatory Visit: Payer: Self-pay

## 2019-01-25 ENCOUNTER — Emergency Department (HOSPITAL_COMMUNITY)
Admission: EM | Admit: 2019-01-25 | Discharge: 2019-01-25 | Disposition: A | Discharge status: Home / Self Care | Payer: Medicaid Other | Attending: Emergency Medicine | Admitting: Emergency Medicine

## 2019-01-25 ENCOUNTER — Encounter (HOSPITAL_COMMUNITY): Payer: Self-pay | Admitting: Emergency Medicine

## 2019-01-25 DIAGNOSIS — M2550 Pain in unspecified joint: Secondary | ICD-10-CM

## 2019-01-25 DIAGNOSIS — Z8619 Personal history of other infectious and parasitic diseases: Secondary | ICD-10-CM | POA: Insufficient documentation

## 2019-01-25 DIAGNOSIS — M130 Polyarthritis, unspecified: Secondary | ICD-10-CM | POA: Insufficient documentation

## 2019-01-25 DIAGNOSIS — Z87891 Personal history of nicotine dependence: Secondary | ICD-10-CM | POA: Insufficient documentation

## 2019-01-25 MED ORDER — PREDNISONE 10 MG (21) PO TBPK: ORAL_TABLET | Freq: Every day | ORAL | 0 refills | Status: DC

## 2019-01-25 NOTE — ED Triage Notes (Signed)
Pt reports having swelling to bilateral knees, elbows, and pinky finger for the last several days with minimal relief with prednisone.

## 2019-01-25 NOTE — ED Provider Notes (Signed)
[facility]-EMERGENCY DEPT Provider Note   CSN: [id number] Arrival date & time: 01/25/19  0028    History   Chief Complaint Chief Complaint  Patient presents with  . Joint Swelling    HPI Katie Middleton is a 33 y.o. female.     HPI  33 year old female comes in a chief complaint of joint pain.  She has history of Lyme disease.  She reports that she was seen in the ER few days ago with multiple joint pain and swelling.  She was started on prednisone and she started noticing immediate relief.  She was asked to follow-up with a rheumatologist, however she has been unable to follow-up with them because of need for referral.  Patient reports that she is almost done with her medications, and she started noticing some swelling in her pinky finger and pain plus swelling in her knees bilaterally.  She did not want her symptoms to progress to the point where she is unable to walk and function like he did previously, therefore she came to the ER.  She denies any history of recent tick bite, rash.  She is adopted therefore unsure of her family history.  Outside of the recent visit for joint pain, she denies any prior history of similar episodes.  Past Medical History:  Diagnosis Date  . Anemia   . Lyme disease     Patient Active Problem List   Diagnosis Date Noted  . Symptomatic anemia 11/12/2018  . Iron deficiency anemia 11/12/2018  . Abdominal pain with vomiting 11/12/2018  . Fluid in endometrial cavity 11/12/2018    Past Surgical History:  Procedure Laterality Date  . CESAREAN SECTION    . TUBAL LIGATION       OB History   No obstetric history on file.      Home Medications    Prior to Admission medications   Medication Sig Start Date End Date Taking? Authorizing Provider  predniSONE (STERAPRED UNI-PAK 21 TAB) 10 MG (21) TBPK tablet Take by mouth daily. Take 6 tabs by mouth daily  for 2 days, then 5 tabs for 2 days, then 4 tabs for 2 days, then 3 tabs  for 2 days, 2 tabs for 2 days, then 1 tab by mouth daily for 2 days 01/25/19   Derwood Kaplan, MD    Family History History reviewed. No pertinent family history.  Social History Social History   Tobacco Use  . Smoking status: Former Smoker    Types: Cigarettes    Last attempt to quit: 12/15/2018    Years since quitting: 0.1  . Smokeless tobacco: Never Used  Substance Use Topics  . Alcohol use: Yes    Comment: occ  . Drug use: No     Allergies   Penicillins; Amoxicillin; and Bactroban [mupirocin calcium]   Review of Systems Review of Systems  Constitutional: Positive for activity change. Negative for fever.  Musculoskeletal: Positive for arthralgias and myalgias.  Allergic/Immunologic: Negative for immunocompromised state.  Hematological: Does not bruise/bleed easily.     Physical Exam Updated Vital Signs BP (!) 102/59   Pulse 85   Temp 98.2 F (36.8 C) (Oral)   Resp 16   Ht 5\' 3"  (1.6 m)   Wt 59 kg   LMP 01/04/2019   SpO2 100%   BMI 23.03 kg/m   Physical Exam Vitals signs and nursing note reviewed.  Constitutional:      Appearance: She is well-developed.  HENT:     Head: Normocephalic and  atraumatic.  Neck:     Musculoskeletal: Normal range of motion and neck supple.  Cardiovascular:     Rate and Rhythm: Normal rate.  Pulmonary:     Effort: Pulmonary effort is normal.  Abdominal:     General: Bowel sounds are normal.  Musculoskeletal:        General: Tenderness present. No deformity.  Skin:    General: Skin is warm and dry.  Neurological:     Mental Status: She is alert and oriented to person, place, and time.      ED Treatments / Results  Labs (all labs ordered are listed, but only abnormal results are displayed) Labs Reviewed - No data to display  EKG None  Radiology No results found.  Procedures Procedures (including critical care time)  Medications Ordered in ED Medications - No data to display   Initial Impression /  Assessment and Plan / ED Course  I have reviewed the triage vital signs and the nursing notes.  Pertinent labs & imaging results that were available during my care of the patient were reviewed by me and considered in my medical decision making (see chart for details).       33 year old female comes in a chief complaint of polyarthralgia. She is having pain in both of her knees and also noticing swelling of her pinky and discomfort with wrist movement.  She had all of these joints hurting last time when she came to the ER, and her symptoms are improved with prednisone taper.  We will represcribed prednisone taper.  I have also messaged social work team to see if they can help patient get a PCP visit quickly for the sake of referral to a rheumatologist.  Additionally, I think that her symptoms could be because of chronic Lyme, but that work-up can be undertaken by either the rheumatologist or PCP.  Final Clinical Impressions(s) / ED Diagnoses   Final diagnoses:  Polyarthralgia    ED Discharge Orders         Ordered    predniSONE (STERAPRED UNI-PAK 21 TAB) 10 MG (21) TBPK tablet  Daily     01/25/19 0117           Derwood Kaplan, MD 01/25/19 0231

## 2019-01-25 NOTE — ED Notes (Signed)
Bed: WA09 Expected date:  Expected time:  Means of arrival:  Comments: 

## 2019-01-25 NOTE — Discharge Instructions (Signed)
Please take the medicine prescribed. We have contacted social worker to help you get an appointment.

## 2019-02-12 ENCOUNTER — Inpatient Hospital Stay: Admission: RE | Admit: 2019-02-12 | Payer: Managed Care, Other (non HMO) | Source: Ambulatory Visit

## 2019-10-09 ENCOUNTER — Emergency Department (HOSPITAL_COMMUNITY)
Admission: EM | Admit: 2019-10-09 | Discharge: 2019-10-09 | Disposition: A | Discharge status: Home / Self Care | Payer: Medicaid Other | Attending: Emergency Medicine | Admitting: Emergency Medicine

## 2019-10-09 ENCOUNTER — Other Ambulatory Visit: Payer: Self-pay

## 2019-10-09 DIAGNOSIS — Z87891 Personal history of nicotine dependence: Secondary | ICD-10-CM | POA: Diagnosis not present

## 2019-10-09 DIAGNOSIS — M069 Rheumatoid arthritis, unspecified: Secondary | ICD-10-CM

## 2019-10-09 DIAGNOSIS — M2550 Pain in unspecified joint: Secondary | ICD-10-CM | POA: Diagnosis present

## 2019-10-09 MED ORDER — PREDNISONE 10 MG (21) PO TBPK: ORAL_TABLET | Freq: Every day | ORAL | 0 refills | Status: DC

## 2019-10-09 NOTE — ED Triage Notes (Signed)
Pt reports she is here due to her RA. Patient reports her joints are swelling and she needs a script for prednisone.  Patient reports she is on her feet constantly.

## 2019-10-09 NOTE — Discharge Instructions (Signed)
Return if any problems.

## 2019-10-09 NOTE — ED Provider Notes (Signed)
[facility] DEPT Provider Note   CSN: [id number] Arrival date & time: 10/09/19  1141     History Chief Complaint  Patient presents with  . Joint Pain    Katie Middleton is a 34 y.o. female.  The history is provided by the patient. No language interpreter was used.  Hand Pain This is a recurrent problem. The current episode started more than 2 days ago. The problem occurs constantly. The problem has been gradually worsening. Nothing aggravates the symptoms. Nothing relieves the symptoms. She has tried nothing for the symptoms. The treatment provided no relief.  Pt complains of swelling in her hands from Rheumatoid arthritis.  Pt request prednisone rx.  Pt has a rheumatologist she can follow up with      Past Medical History:  Diagnosis Date  . Anemia   . Lyme disease     Patient Active Problem List   Diagnosis Date Noted  . Symptomatic anemia 11/12/2018  . Iron deficiency anemia 11/12/2018  . Abdominal pain with vomiting 11/12/2018  . Fluid in endometrial cavity 11/12/2018    Past Surgical History:  Procedure Laterality Date  . CESAREAN SECTION    . TUBAL LIGATION       OB History   No obstetric history on file.     No family history on file.  Social History   Tobacco Use  . Smoking status: Former Smoker    Types: Cigarettes    Quit date: 12/15/2018    Years since quitting: 0.8  . Smokeless tobacco: Never Used  Substance Use Topics  . Alcohol use: Yes    Comment: occ  . Drug use: No    Home Medications Prior to Admission medications   Medication Sig Start Date End Date Taking? Authorizing Provider  predniSONE (STERAPRED UNI-PAK 21 TAB) 10 MG (21) TBPK tablet Take by mouth daily. Take 6 tabs by mouth daily  for 2 days, then 5 tabs for 2 days, then 4 tabs for 2 days, then 3 tabs for 2 days, 2 tabs for 2 days, then 1 tab by mouth daily for 2 days 01/25/19   Varney Biles, MD    Allergies    Penicillins, Amoxicillin, and  Bactroban [mupirocin calcium]  Review of Systems   Review of Systems  Musculoskeletal: Positive for joint swelling and myalgias.  All other systems reviewed and are negative.   Physical Exam Updated Vital Signs BP 131/66 (BP Location: Right Arm)   Pulse 83   Temp 98.5 F (36.9 C) (Oral)   Resp 18   SpO2 100%   Physical Exam Vitals and nursing note reviewed.  Constitutional:      Appearance: She is well-developed.  HENT:     Head: Normocephalic.  Cardiovascular:     Rate and Rhythm: Normal rate.  Pulmonary:     Effort: Pulmonary effort is normal.  Abdominal:     General: There is no distension.  Musculoskeletal:        General: Swelling present. Normal range of motion.     Cervical back: Normal range of motion.     Comments: Swollen hands   Skin:    General: Skin is warm.  Neurological:     Mental Status: She is alert and oriented to person, place, and time.  Psychiatric:        Mood and Affect: Mood normal.     ED Results / Procedures / Treatments   Labs (all labs ordered are listed, but only abnormal results are  displayed) Labs Reviewed - No data to display  EKG None  Radiology No results found.  Procedures Procedures (including critical care time)  Medications Ordered in ED Medications - No data to display  ED Course  I have reviewed the triage vital signs and the nursing notes.  Pertinent labs & imaging results that were available during my care of the patient were reviewed by me and considered in my medical decision making (see chart for details).    MDM Rules/Calculators/A&P                      MDM: Pt looks good,  Pt given rx for prednisone  Final Clinical Impression(s) / ED Diagnoses Final diagnoses:  Rheumatoid arthritis flare (HCC)    Rx / DC Orders ED Discharge Orders         Ordered    predniSONE (STERAPRED UNI-PAK 21 TAB) 10 MG (21) TBPK tablet  Daily     10/09/19 1207        An After Visit Summary was printed and given to  the patient.    Osie Cheeks 10/09/19 1207    Linwood Dibbles, MD 10/10/19 [id number]

## 2020-01-04 ENCOUNTER — Encounter (HOSPITAL_BASED_OUTPATIENT_CLINIC_OR_DEPARTMENT_OTHER): Payer: Self-pay

## 2020-01-04 ENCOUNTER — Other Ambulatory Visit: Payer: Self-pay

## 2020-01-04 ENCOUNTER — Emergency Department (HOSPITAL_BASED_OUTPATIENT_CLINIC_OR_DEPARTMENT_OTHER)
Admission: EM | Admit: 2020-01-04 | Discharge: 2020-01-04 | Disposition: A | Discharge status: Home / Self Care | Payer: Medicaid Other | Attending: Emergency Medicine | Admitting: Emergency Medicine

## 2020-01-04 DIAGNOSIS — Z881 Allergy status to other antibiotic agents status: Secondary | ICD-10-CM | POA: Diagnosis not present

## 2020-01-04 DIAGNOSIS — Z87891 Personal history of nicotine dependence: Secondary | ICD-10-CM | POA: Insufficient documentation

## 2020-01-04 DIAGNOSIS — Z880 Allergy status to penicillin: Secondary | ICD-10-CM | POA: Diagnosis not present

## 2020-01-04 DIAGNOSIS — M25532 Pain in left wrist: Secondary | ICD-10-CM | POA: Insufficient documentation

## 2020-01-04 DIAGNOSIS — M2550 Pain in unspecified joint: Secondary | ICD-10-CM

## 2020-01-04 DIAGNOSIS — M130 Polyarthritis, unspecified: Secondary | ICD-10-CM | POA: Insufficient documentation

## 2020-01-04 DIAGNOSIS — M25531 Pain in right wrist: Secondary | ICD-10-CM | POA: Diagnosis present

## 2020-01-04 MED ORDER — PREDNISONE 10 MG (21) PO TBPK: ORAL_TABLET | Freq: Every day | ORAL | 0 refills | Status: DC

## 2020-01-04 MED FILL — PREDNISONE 10 MG TABS: 10 | 12 days supply | Qty: 42 | Fill #0

## 2020-01-04 NOTE — Discharge Instructions (Signed)
Take prednisone as prescribed. Use Tylenol and ibuprofen as needed for pain. Follow-up with the rheumatologist or primary care doctor for further management of your symptoms.  Return to the emergency room if you develop fevers, if 1 joint becomes very red/hot/swollen, or with any new, worsening, or concerning symptoms.

## 2020-01-04 NOTE — ED Notes (Signed)
ED Provider at bedside. 

## 2020-01-04 NOTE — ED Triage Notes (Signed)
Pt states that she has RA reports for the last 2 weeks she has had increased joint pain, pt states she needs Rx for prednisone.

## 2020-01-04 NOTE — ED Provider Notes (Signed)
[place] EMERGENCY DEPARTMENT Provider Note   CSN: [id number] Arrival date & time: 01/04/20  1218     History Chief Complaint  Patient presents with  . Joint Pain  . Medication Refill    Katie Middleton is a 34 y.o. female presenting for evaluation of polyarthralgia.  Patient states that the past 2 weeks, she has been having pain in her bilateral wrists, shoulders, and hands.  Patient states this feels the same as her previous RA flares.  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  She denies fall, trauma, or injury.  She denies fevers, chills, redness, or swelling of any joint.  She is sexually active with one female partner.  She denies vaginal discharge.  She states she has been tested for STDs which has been negative in the past.  Upon chart review, patient has been diagnosed with fibromyalgia and Lyme's disease in the past.  Patient states she has been told that she cleared the Lyme's disease infection.  She states she was never formally diagnosed with fibromyalgia.  She has not been taking anything for her symptoms including Tylenol ibuprofen.  Nothing makes her pain better or worse.  She states he has no other medical problems, takes no medications daily.  HPI     Past Medical History:  Diagnosis Date  . Anemia   . Lyme disease     Patient Active Problem List   Diagnosis Date Noted  . Symptomatic anemia 11/12/2018  . Iron deficiency anemia 11/12/2018  . Abdominal pain with vomiting 11/12/2018  . Fluid in endometrial cavity 11/12/2018    Past Surgical History:  Procedure Laterality Date  . CESAREAN SECTION    . TUBAL LIGATION       OB History   No obstetric history on file.     No family history on file.  Social History   Tobacco Use  . Smoking status: Former Smoker    Types: Cigarettes    Quit date:  12/15/2018    Years since quitting: 1.0  . Smokeless tobacco: Never Used  Substance Use Topics  . Alcohol use: Yes    Comment: occ  . Drug use: No    Home Medications Prior to Admission medications   Medication Sig Start Date End Date Taking? Authorizing Provider  predniSONE (STERAPRED UNI-PAK 21 TAB) 10 MG (21) TBPK tablet Take by mouth daily. 6 tabs PO qd X 2 days, 5 tabs x 2 days, 4 tabs x2 days, 3 tabs x2 days, 2 tabs x2 days, 1 tab x 2 days 01/04/20   Quintin Hjort, PA-C    Allergies    Penicillins, Amoxicillin, and Bactroban [mupirocin calcium]  Review of Systems   Review of Systems  Constitutional: Negative for fever.  Musculoskeletal: Positive for arthralgias.    Physical Exam Updated Vital Signs BP 104/77 (BP Location: Right Arm)   Pulse 92   Temp 98.4 F (36.9 C) (Oral)   Resp 18   Ht 5\' 3"  (1.6 m)   Wt 64 kg   LMP 12/21/2019   SpO2 100%   BMI 24.98 kg/m   Physical Exam Vitals and nursing note reviewed.  Constitutional:      General: She is not in acute distress.    Appearance: She is well-developed.     Comments: Resting comfortably in the bed in no acute distress  HENT:     Head: Normocephalic and atraumatic.  Eyes:     Conjunctiva/sclera: Conjunctivae normal.     Pupils: Pupils are equal, round, and reactive to light.  Cardiovascular:     Rate and Rhythm: Normal rate and regular rhythm.     Pulses: Normal pulses.  Pulmonary:     Effort: Pulmonary effort is normal. No respiratory distress.     Breath sounds: Normal breath sounds. No wheezing.  Abdominal:     General: There is no distension.     Palpations: Abdomen is soft. There is no mass.     Tenderness: There is no abdominal tenderness. There is no guarding or rebound.  Musculoskeletal:        General: No swelling. Normal range of motion.     Cervical back: Normal range of motion and neck supple.     Comments: No obvious swelling, erythema, warmth of any joint.  No obvious tenderness with  palpation of either shoulder, wrist, hands.  Radial pulses 2+ bilaterally.  Grip strength intact bilaterally.  Skin:    General: Skin is warm and dry.     Capillary Refill: Capillary refill takes less than 2 seconds.  Neurological:     Mental Status: She is alert and oriented to person, place, and time.     ED Results / Procedures / Treatments   Labs (all labs ordered are listed, but only abnormal results are displayed) Labs Reviewed - No data to display  EKG None  Radiology No results found.  Procedures Procedures (including critical care time)  Medications Ordered in ED Medications - No data to display  ED Course  I have reviewed the triage vital signs and the nursing notes.  Pertinent labs & imaging results that were available during my care of the patient were reviewed by me and considered in my medical decision making (see chart for details).    MDM Rules/Calculators/A&P                      Patient presenting for evaluation of polyarthralgia.  On exam, patient appears nontoxic.  No sign of septic joint or infected joint.  No appreciable swelling on my exam.  However patient states this feels similar to previous RA flares.  I am not sure about patient's actual diagnosis of RA, however as she has had improvement with steroids in the past, will repeat a course of steroids.  Discussed my concern for her chronic/frequent use of steroids.  Discussed my concern that this is not being followed by anyone on a regular basis.  Discussed other options of treatment such as Tylenol and ibuprofen.  Discussed warning signs for infection including signs of septic joint, prompt return if she sees any of this.  Encouraged continued attempts to follow-up with PCP and/or rheumatology for further management.  At this time, patient appears safe for discharge.  Return precautions given.  Patient states she understands and agrees to plan.  Final Clinical Impression(s) / ED Diagnoses Final  diagnoses:  Polyarthralgia    Rx / DC Orders ED Discharge Orders         Ordered    predniSONE (STERAPRED UNI-PAK 21 TAB) 10 MG (21) TBPK tablet  Daily     01/04/20 1246           Leonell Lobdell, PA-C 01/04/20 1254    Terrilee Files, MD 01/04/20 1730

## 2020-01-04 NOTE — ED Notes (Signed)
Pt discharged to home. Discharge instructions have been discussed with patient and/or family members. Pt verbally acknowledges understanding d/c instructions, and endorses comprehension to checkout at registration before leaving.  °

## 2020-03-13 ENCOUNTER — Emergency Department (HOSPITAL_BASED_OUTPATIENT_CLINIC_OR_DEPARTMENT_OTHER)
Admission: EM | Admit: 2020-03-13 | Discharge: 2020-03-13 | Disposition: A | Discharge status: Home / Self Care | Payer: Medicaid Other | Attending: Emergency Medicine | Admitting: Emergency Medicine

## 2020-03-13 ENCOUNTER — Encounter (HOSPITAL_BASED_OUTPATIENT_CLINIC_OR_DEPARTMENT_OTHER): Payer: Self-pay | Admitting: *Deleted

## 2020-03-13 ENCOUNTER — Other Ambulatory Visit: Payer: Self-pay

## 2020-03-13 DIAGNOSIS — Z87891 Personal history of nicotine dependence: Secondary | ICD-10-CM | POA: Insufficient documentation

## 2020-03-13 DIAGNOSIS — M79641 Pain in right hand: Secondary | ICD-10-CM | POA: Insufficient documentation

## 2020-03-13 DIAGNOSIS — M79642 Pain in left hand: Secondary | ICD-10-CM | POA: Diagnosis not present

## 2020-03-13 DIAGNOSIS — Z760 Encounter for issue of repeat prescription: Secondary | ICD-10-CM | POA: Diagnosis present

## 2020-03-13 DIAGNOSIS — M25542 Pain in joints of left hand: Secondary | ICD-10-CM

## 2020-03-13 HISTORY — DX: Rheumatoid arthritis, unspecified: M06.9

## 2020-03-13 MED ORDER — NAPROXEN 375 MG PO TABS: 375.0000 mg | ORAL_TABLET | Freq: Two times a day (BID) | ORAL | 0 refills | Status: AC

## 2020-03-13 MED FILL — NAPROXEN 375 MG TABLET: 375 | 5 days supply | Qty: 10 | Fill #0

## 2020-03-13 NOTE — ED Triage Notes (Signed)
Seen her PCP last year.  She was prescribed with a rheumatoid arthritis medicine but does not remember the Medicine.  Patient came in today due to swollen and stiffness on her hands, fingers and wrists.

## 2020-03-13 NOTE — ED Provider Notes (Signed)
[facility] EMERGENCY DEPARTMENT Provider Note   CSN: [id number] Arrival date & time: 03/13/20  0754     History Chief Complaint  Patient presents with   Med Refill    Katie Middleton is a 34 y.o. female history rheumatoid arthritis, Lyme disease, anemia, bilateral tubal ligations presents today requesting refill of her diclofenac.  She reports history of rheumatoid arthritis normally treated with NSAIDs and prednisone.  She has not had a flare in the past 2 months.  Her last flare was treated with prednisone.  Over the last several days she has developed bilateral hand pain described as an aching constant pain worse with movement improved with rest, she is not attempted any medications with this flare describes pain as moderate in intensity and nonradiating.  She describes her symptoms as consistent with her prior flare without abnormal feature.  She denies any injury/trauma, fever/chills, neck/back pain, erythema, numbness/tingling, weakness or any additional concerns.  HPI     Past Medical History:  Diagnosis Date   Anemia    Lyme disease    Rheumatoid arthritis ([place])     Patient Active Problem List   Diagnosis Date Noted   Symptomatic anemia 11/12/2018   Iron deficiency anemia 11/12/2018   Abdominal pain with vomiting 11/12/2018   Fluid in endometrial cavity 11/12/2018    Past Surgical History:  Procedure Laterality Date   CESAREAN SECTION     TUBAL LIGATION       OB History    Gravida  4   Para  3   Term      Preterm      AB  1   Living        SAB      TAB      Ectopic      Multiple      Live Births              Family History  Problem Relation Age of Onset   Heart attack Father     Social History   Tobacco Use   Smoking status: Former Smoker    Types: Cigarettes    Quit date: 12/15/2018    Years since quitting: 1.2   Smokeless tobacco: Never Used  Substance Use Topics   Alcohol use: Yes    Comment: occ     Drug use: No    Home Medications Prior to Admission medications   Medication Sig Start Date End Date Taking? Authorizing Provider  naproxen (NAPROSYN) 375 MG tablet Take 1 tablet (375 mg total) by mouth 2 (two) times daily for 5 days. 03/13/20 03/18/20  Nuala Alpha A, PA-C    Allergies    Bactrim [sulfamethoxazole-trimethoprim], Penicillins, Amoxicillin, and Bactroban [mupirocin calcium]  Review of Systems   Review of Systems  Constitutional: Negative.  Negative for chills and fever.  Musculoskeletal: Positive for arthralgias. Negative for back pain and neck pain.  Skin: Negative.  Negative for color change and wound.  Neurological: Negative.  Negative for weakness and numbness.    Physical Exam Updated Vital Signs BP 105/62 (BP Location: Right Arm)    Pulse 84    Temp 98.4 F (36.9 C) (Oral)    Resp 16    Ht 5\' 3"  (1.6 m)    Wt 66.2 kg    LMP 02/21/2020    SpO2 99%    BMI 25.86 kg/m   Physical Exam Constitutional:      General: She is not in acute distress.  Appearance: Normal appearance. She is well-developed. She is not ill-appearing or diaphoretic.  HENT:     Head: Normocephalic and atraumatic.  Eyes:     General: Vision grossly intact. Gaze aligned appropriately.     Pupils: Pupils are equal, round, and reactive to light.  Neck:     Trachea: Trachea and phonation normal.  Pulmonary:     Effort: Pulmonary effort is normal. No respiratory distress.  Abdominal:     General: There is no distension.     Palpations: Abdomen is soft.     Tenderness: There is no abdominal tenderness. There is no guarding or rebound.  Musculoskeletal:        General: Normal range of motion.     Cervical back: Normal range of motion.     Comments: Normal-appearing bilateral hands.  Good capillary refill and sensation to all fingers.  Strong equal radial pulses.  Strong and equal strength with all movements of bilateral fingers, hands, wrists and elbows.  Minimal increased pain with  movements of the hand.  No skin break.  No obvious swelling or color change.  Compartments soft.  Skin:    General: Skin is warm and dry.  Neurological:     Mental Status: She is alert.     GCS: GCS eye subscore is 4. GCS verbal subscore is 5. GCS motor subscore is 6.     Comments: Speech is clear and goal oriented, follows commands Major Cranial nerves without deficit, no facial droop Moves extremities without ataxia, coordination intact  Psychiatric:        Behavior: Behavior normal.     ED Results / Procedures / Treatments   Labs (all labs ordered are listed, but only abnormal results are displayed) Labs Reviewed - No data to display  EKG None  Radiology No results found.  Procedures Procedures (including critical care time)  Medications Ordered in ED Medications - No data to display  ED Course  I have reviewed the triage vital signs and the nursing notes.  Pertinent labs & imaging results that were available during my care of the patient were reviewed by me and considered in my medical decision making (see chart for details).    MDM Rules/Calculators/A&P                         Additional History Obtained: 1. Nursing notes from this visit. 2. Reviewed patient's prior ED visits she had one visit in January 2021 and another in April 2021 each time was prescribed a prednisone burst. It appears she had 3 visits in the year of 2020 for similar symptoms most of the time treated with steroids.   Patient presents today with what she describes as her typical arthralgia of her bilateral hands.  There is no evidence of trauma or infection.  No sign of septic arthritis, cellulitis, DVT, compartment syndrome, neurovascular compromise or other emergent pathologies.  No indication for imaging or blood work at this time.  I discussed with patient her previous ED visits, she is anxious about starting a another course of steroids she is requesting to start an NSAID medication instead.   She has not attempted any anti-inflammatories at home.  Will prescribe patient naproxen 325 mg twice daily x5 days and give her referral for primary care provider for follow-up appointment.  Of note patient denies chance of pregnancy today.  Patient states understanding that medications given or prescribed today may result in harm to of a pregnancy  and she accepts these risks and still chooses not to be pregnancy tested and proceed with medications.  Additionally patient denies history of CKD or gastric ulcers or adverse reaction to NSAIDs.   At this time there does not appear to be any evidence of an acute emergency medical condition and the patient appears stable for discharge with appropriate outpatient follow up. Diagnosis was discussed with patient who verbalizes understanding of care plan and is agreeable to discharge. I have discussed return precautions with patient who verbalizes understanding. Patient encouraged to follow-up with their PCP. All questions answered.  Note: Portions of this report may have been transcribed using voice recognition software. Every effort was made to ensure accuracy; however, inadvertent computerized transcription errors may still be present. Final Clinical Impression(s) / ED Diagnoses Final diagnoses:  Arthralgia of both hands    Rx / DC Orders ED Discharge Orders         Ordered    naproxen (NAPROSYN) 375 MG tablet  2 times daily     Discontinue  Reprint     03/13/20 0951           Bill Salinas, PA-C 03/13/20 1028    Geoffery Lyons, MD 03/14/20 [phone]

## 2020-03-13 NOTE — Discharge Instructions (Addendum)
At this time there does not appear to be the presence of an emergent medical condition, however there is always the potential for conditions to change. Please read and follow the below instructions.  Please return to the Emergency Department immediately for any new or worsening symptoms. Please be sure to follow up with your Primary Care Provider within one week regarding your visit today; please call their office to schedule an appointment even if you are feeling better for a follow-up visit. You have been prescribed an NSAID-containing medication called Naproxen today.  Do not take the medications including ibuprofen, Aleve, Advil or other NSAID-containing medications while taking Naproxen.  Please be sure to drink enough water.   Get help right away if: You cannot move the joint. Your fingers or toes tingle, lose feeling, or get cold and blue. You have a fever along with a joint that is red, warm, and swollen. You have any new/concerning or worsening of symptoms  Please read the additional information packets attached to your discharge summary.  Do not take your medicine if  develop an itchy rash, swelling in your mouth or lips, or difficulty breathing; call 911 and seek immediate emergency medical attention if this occurs.  You may review your lab tests and imaging results in their entirety on your MyChart account.  Please discuss all results of fully with your primary care provider and other specialist at your follow-up visit.  Note: Portions of this text may have been transcribed using voice recognition software. Every effort was made to ensure accuracy; however, inadvertent computerized transcription errors may still be present.

## 2020-04-09 ENCOUNTER — Encounter (HOSPITAL_COMMUNITY): Payer: Self-pay | Admitting: Emergency Medicine

## 2020-04-09 ENCOUNTER — Other Ambulatory Visit: Payer: Self-pay

## 2020-04-09 ENCOUNTER — Emergency Department (HOSPITAL_COMMUNITY)
Admission: EM | Admit: 2020-04-09 | Discharge: 2020-04-09 | Disposition: A | Discharge status: Home / Self Care | Payer: Medicaid Other | Attending: Emergency Medicine | Admitting: Emergency Medicine

## 2020-04-09 DIAGNOSIS — Z87891 Personal history of nicotine dependence: Secondary | ICD-10-CM | POA: Insufficient documentation

## 2020-04-09 DIAGNOSIS — Z760 Encounter for issue of repeat prescription: Secondary | ICD-10-CM

## 2020-04-09 DIAGNOSIS — M069 Rheumatoid arthritis, unspecified: Secondary | ICD-10-CM | POA: Diagnosis not present

## 2020-04-09 DIAGNOSIS — M79606 Pain in leg, unspecified: Secondary | ICD-10-CM | POA: Diagnosis present

## 2020-04-09 MED ORDER — DICLOFENAC SODIUM 75 MG PO TBEC: 75.0000 mg | DELAYED_RELEASE_TABLET | Freq: Two times a day (BID) | ORAL | 0 refills | Status: DC

## 2020-04-09 MED ORDER — DICLOFENAC SODIUM 75 MG PO TBEC
75.0000 mg | DELAYED_RELEASE_TABLET | Freq: Once | ORAL | Status: AC
  Administered 2020-04-09: 75 mg via ORAL
  Filled 2020-04-09: qty 1

## 2020-04-09 NOTE — ED Provider Notes (Signed)
[facility]-EMERGENCY DEPT Provider Note: [name], MD, FACEP  CSN: [id number] MRN: [id number] ARRIVAL: 04/09/20 at 0240 ROOM: WTR7/WTR7   CHIEF COMPLAINT  Leg Pain   HISTORY OF PRESENT ILLNESS  04/09/20 4:32 AM Katie Middleton is a 34 y.o. female with a history of rheumatoid arthritis.  She is here with a rheumatoid arthritis flareup.  This is causing pain in her legs, primarily the anterior lateral thighs.  She rates the pain is a 7 out of 10.  It is worse with standing.  She was recently seen at [facility] and was given a prescription for naproxen.  The naproxen is causing her to have edema and she would prefer not to take it.  She attempted to get her diclofenac 75 mg filled but could not get ahold of her rheumatologist.   Past Medical History:  Diagnosis Date  . Anemia   . Lyme disease   . Rheumatoid arthritis [facility])     Past Surgical History:  Procedure Laterality Date  . CESAREAN SECTION    . TUBAL LIGATION      Family History  Problem Relation Age of Onset  . Heart attack Father     Social History   Tobacco Use  . Smoking status: Former Smoker    Types: Cigarettes    Quit date: 12/15/2018    Years since quitting: 1.3  . Smokeless tobacco: Never Used  Substance Use Topics  . Alcohol use: Yes    Comment: occ  . Drug use: No    Prior to Admission medications   Medication Sig Start Date End Date Taking? Authorizing Provider  diclofenac (VOLTAREN) 75 MG EC tablet Take 1 tablet (75 mg total) by mouth 2 (two) times daily. 04/09/20   Gabriella Guile, MD    Allergies Bactrim [sulfamethoxazole-trimethoprim], Penicillins, Amoxicillin, and Bactroban [mupirocin calcium]   REVIEW OF SYSTEMS  Negative except as noted here or in the History of Present Illness.   PHYSICAL EXAMINATION  Initial Vital Signs Blood pressure (!) 100/56, pulse 93, temperature 98.3 F (36.8 C), temperature source Oral, resp. rate 18, height 5\' 3"  (1.6 m), weight 59 kg, SpO2 100  %.  Examination General: Well-developed, well-nourished female in no acute distress; appearance consistent with age of record HENT: normocephalic; atraumatic Eyes: pupils equal, round and reactive to light; extraocular muscles intact Neck: supple Heart: regular rate and rhythm Lungs: clear to auscultation bilaterally Abdomen: soft; nondistended; nontender; bowel sounds present Extremities: No deformity; full range of motion; pulses normal; mild edema of hands Neurologic: Awake, alert and oriented; motor function intact in all extremities and symmetric; no facial droop Skin: Warm and dry Psychiatric: Normal mood and affect   RESULTS  Summary of this visit's results, reviewed and interpreted by myself:   EKG Interpretation  Date/Time:    Ventricular Rate:    PR Interval:    QRS Duration:   QT Interval:    QTC Calculation:   R Axis:     Text Interpretation:        Laboratory Studies: No results found for this or any previous visit (from the past 24 hour(s)). Imaging Studies: No results found.  ED COURSE and MDM  Nursing notes, initial and subsequent vitals signs, including pulse oximetry, reviewed and interpreted by myself.  Vitals:   04/09/20 0250  BP: (!) 100/56  Pulse: 93  Resp: 18  Temp: 98.3 F (36.8 C)  TempSrc: Oral  SpO2: 100%  Weight: 59 kg  Height: 5\' 3"  (1.6 m)  Medications  diclofenac (VOLTAREN) EC tablet 75 mg (has no administration in time range)   We will refill patient's Voltaren pending follow-up with rheumatologist.   PROCEDURES  Procedures   ED DIAGNOSES     ICD-10-CM   1. Rheumatoid arthritis flare (HCC)  M06.9   2. Medication refill  Z76.0        Deshunda Thackston, Jonny Ruiz, MD 04/09/20 [phone]

## 2020-04-09 NOTE — ED Triage Notes (Signed)
Patient arrives complaining of an RA flare up. Patient complaining of leg pain. Patient states that she was seen at [facility] [place] and given an RX for naproxen. Patient states that it caused her to swell. Patient states she attempted to get her RA medication but insurance denied it.

## 2020-04-24 ENCOUNTER — Other Ambulatory Visit: Payer: Self-pay

## 2020-04-24 ENCOUNTER — Emergency Department (HOSPITAL_COMMUNITY)
Admission: EM | Admit: 2020-04-24 | Discharge: 2020-04-24 | Disposition: A | Discharge status: Home / Self Care | Payer: Medicaid Other | Attending: Emergency Medicine | Admitting: Emergency Medicine

## 2020-04-24 ENCOUNTER — Emergency Department (HOSPITAL_BASED_OUTPATIENT_CLINIC_OR_DEPARTMENT_OTHER)
Admit: 2020-04-24 | Discharge: 2020-04-24 | Disposition: A | Discharge status: Home / Self Care | Payer: Medicaid Other | Attending: Emergency Medicine | Admitting: Emergency Medicine

## 2020-04-24 ENCOUNTER — Encounter (HOSPITAL_COMMUNITY): Payer: Self-pay

## 2020-04-24 DIAGNOSIS — D509 Iron deficiency anemia, unspecified: Secondary | ICD-10-CM | POA: Insufficient documentation

## 2020-04-24 DIAGNOSIS — M25569 Pain in unspecified knee: Secondary | ICD-10-CM | POA: Diagnosis present

## 2020-04-24 DIAGNOSIS — R638 Other symptoms and signs concerning food and fluid intake: Secondary | ICD-10-CM | POA: Insufficient documentation

## 2020-04-24 DIAGNOSIS — M069 Rheumatoid arthritis, unspecified: Secondary | ICD-10-CM

## 2020-04-24 DIAGNOSIS — K5900 Constipation, unspecified: Secondary | ICD-10-CM | POA: Insufficient documentation

## 2020-04-24 DIAGNOSIS — M0609 Rheumatoid arthritis without rheumatoid factor, multiple sites: Secondary | ICD-10-CM | POA: Insufficient documentation

## 2020-04-24 DIAGNOSIS — R0981 Nasal congestion: Secondary | ICD-10-CM | POA: Insufficient documentation

## 2020-04-24 DIAGNOSIS — M7989 Other specified soft tissue disorders: Secondary | ICD-10-CM | POA: Diagnosis not present

## 2020-04-24 LAB — CBC
HCT: 22 % — ABNORMAL LOW (ref 36.0–46.0)
Hemoglobin: 5.9 g/dL — CL (ref 12.0–15.0)
MCH: 17.2 pg — ABNORMAL LOW (ref 26.0–34.0)
MCHC: 26.8 g/dL — ABNORMAL LOW (ref 30.0–36.0)
MCV: 64.1 fL — ABNORMAL LOW (ref 80.0–100.0)
Platelets: 469 10*3/uL — ABNORMAL HIGH (ref 150–400)
RBC: 3.43 MIL/uL — ABNORMAL LOW (ref 3.87–5.11)
RDW: 20.6 % — ABNORMAL HIGH (ref 11.5–15.5)
WBC: 7 10*3/uL (ref 4.0–10.5)
nRBC: 0 % (ref 0.0–0.2)

## 2020-04-24 LAB — COMPREHENSIVE METABOLIC PANEL
ALT: 8 U/L (ref 0–44)
AST: 19 U/L (ref 15–41)
Albumin: 2.9 g/dL — ABNORMAL LOW (ref 3.5–5.0)
Alkaline Phosphatase: 52 U/L (ref 38–126)
Anion gap: 5 (ref 5–15)
BUN: 13 mg/dL (ref 6–20)
CO2: 26 mmol/L (ref 22–32)
Calcium: 8.3 mg/dL — ABNORMAL LOW (ref 8.9–10.3)
Chloride: 106 mmol/L (ref 98–111)
Creatinine, Ser: 0.66 mg/dL (ref 0.44–1.00)
GFR calc Af Amer: >60 mL/min (ref >60)
GFR calc non Af Amer: >60 mL/min (ref >60)
Glucose, Bld: 92 mg/dL (ref 70–99)
Potassium: 4.3 mmol/L (ref 3.5–5.1)
Sodium: 137 mmol/L (ref 135–145)
Total Bilirubin: 0.5 mg/dL (ref 0.3–1.2)
Total Protein: 7.9 g/dL (ref 6.5–8.1)

## 2020-04-24 LAB — PREPARE RBC (CROSSMATCH)

## 2020-04-24 LAB — I-STAT BETA HCG BLOOD, ED (NOT ORDERABLE): I-stat hCG, quantitative: <5 m[IU]/mL (ref <5)

## 2020-04-24 LAB — LIPASE, BLOOD: Lipase: 22 U/L (ref 11–51)

## 2020-04-24 MED ORDER — SODIUM CHLORIDE 0.9% FLUSH: 3.0000 mL | Freq: Once | INTRAVENOUS | Status: DC

## 2020-04-24 MED ORDER — SODIUM CHLORIDE 0.9% IV SOLUTION: Freq: Once | INTRAVENOUS | Status: AC

## 2020-04-24 MED ORDER — PREDNISONE 10 MG (21) PO TBPK: ORAL_TABLET | ORAL | 0 refills | Status: DC

## 2020-04-24 MED ORDER — PREDNISONE 20 MG PO TABS
20.0000 mg | ORAL_TABLET | Freq: Once | ORAL | Status: AC
  Administered 2020-04-24: 20 mg via ORAL
  Filled 2020-04-24: qty 1

## 2020-04-24 NOTE — ED Notes (Addendum)
Vas at bedside 

## 2020-04-24 NOTE — ED Triage Notes (Signed)
Patient states that she was diagnosed with rheumatoid arthritis last year and the medication that is prescribed for her is not working at all now for her arthritis pain in both wrists and knees.  Patient states that she has not had a BM in 2 days and normally goes daily. Patient also c/o nausea, but states she only vomited x 1  8 days ago.

## 2020-04-24 NOTE — ED Notes (Addendum)
Pt requesting to hold blood work until ED provider has been in. Pt requesting not to change into gown at this time.

## 2020-04-24 NOTE — ED Provider Notes (Signed)
[facility]-EMERGENCY DEPT Provider Note   CSN: [id number] Arrival date & time: 04/24/20  1607     History Chief Complaint  Patient presents with  . Knee Pain  . Wrist Pain  . Constipation  . Nausea    Katie Middleton is a 34 y.o. female.  HPI  34 yo female history of RA, not currently treated.  Seen here 2 weeks ago with leg pains thought to represent her RA symptoms. Patient given rx for diclofenac.  Patient states she has been taking the diclofenac , but has some stomach discomfort and decreased bowel movemnts.  She also has swelling bilateral wrist, and left ankle.  Denies fever.  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  Not vaccinated. No current PMD or rheumatologist Periods regular, first day last period July 31,normal period, s/p btl    Past Medical History:  Diagnosis Date  . Anemia   . Lyme disease   . Rheumatoid arthritis [facility])     Patient Active Problem List   Diagnosis Date Noted  . Symptomatic anemia 11/12/2018  . Iron deficiency anemia 11/12/2018  . Abdominal pain with vomiting 11/12/2018  . Fluid in endometrial cavity 11/12/2018    Past Surgical History:  Procedure Laterality Date  . CESAREAN SECTION    . TUBAL LIGATION       OB History    Gravida  4   Para  3   Term      Preterm      AB  1   Living        SAB      TAB      Ectopic      Multiple      Live Births              Family History  Problem Relation Age of Onset  . Heart attack Father     Social History   Tobacco Use  . Smoking status: Former Smoker    Types: Cigarettes    Quit date: 12/15/2018    Years since quitting: 1.3  . Smokeless tobacco: Never Used  Vaping Use  . Vaping Use: Never used  Substance Use Topics  . Alcohol use: Yes    Comment: occ  . Drug use: No    Home Medications Prior to Admission medications   Medication Sig Start Date End Date Taking? Authorizing Provider  diclofenac (VOLTAREN) 75 MG EC tablet  Take 1 tablet (75 mg total) by mouth 2 (two) times daily. 04/09/20   Molpus, John, MD    Allergies    Bactrim [sulfamethoxazole-trimethoprim], Penicillins, Amoxicillin, and Bactroban [mupirocin calcium]  Review of Systems   Review of Systems  Constitutional: Positive for appetite change. Negative for unexpected weight change.  HENT: Positive for congestion.   Eyes: Negative.   Respiratory: Negative.   Cardiovascular: Positive for leg swelling.  Gastrointestinal: Positive for constipation.  Endocrine: Negative.   Genitourinary: Negative.   Musculoskeletal: Positive for arthralgias and joint swelling.  Skin: Negative.   Allergic/Immunologic: Negative.   Neurological: Negative.   Hematological: Negative.   Psychiatric/Behavioral: Negative.   All other systems reviewed and are negative.   Physical Exam Updated Vital Signs BP (!) 114/54 (BP Location: Right Arm)   Pulse 92   Temp 98.2 F (36.8 C) (Oral)   Resp 16   Ht 1.6 m (5\' 3" )   Wt 63.5 kg   LMP 04/21/2020   SpO2 100%   BMI 24.80 kg/m   Physical Exam  Vitals and nursing note reviewed.  Constitutional:      Appearance: Normal appearance.  HENT:     Head: Normocephalic.     Right Ear: External ear normal.     Left Ear: External ear normal.     Nose: Nose normal.     Mouth/Throat:     Pharynx: Oropharynx is clear.  Eyes:     Pupils: Pupils are equal, round, and reactive to light.  Cardiovascular:     Rate and Rhythm: Normal rate and regular rhythm.     Pulses: Normal pulses.  Pulmonary:     Effort: Pulmonary effort is normal.     Breath sounds: Normal breath sounds.  Abdominal:     General: Abdomen is flat.  Musculoskeletal:        General: Swelling present. Normal range of motion.     Cervical back: Normal range of motion.  Skin:    General: Skin is warm and dry.     Capillary Refill: Capillary refill takes less than 2 seconds.  Neurological:     General: No focal deficit present.     Mental Status: She  is alert.  Psychiatric:        Mood and Affect: Mood normal.     ED Results / Procedures / Treatments   Labs (all labs ordered are listed, but only abnormal results are displayed) Labs Reviewed  LIPASE, BLOOD  COMPREHENSIVE METABOLIC PANEL  CBC  URINALYSIS, ROUTINE W REFLEX MICROSCOPIC  I-STAT BETA HCG BLOOD, ED ([facility], [facility], [facility] ONLY)    EKG None  Radiology No results found.  Procedures Procedures (including critical care time)  Medications Ordered in ED Medications  sodium chloride flush (NS) 0.9 % injection 3 mL (has no administration in time range)    ED Course  I have reviewed the triage vital signs and the nursing notes.  Pertinent labs & imaging results that were available during my care of the patient were reviewed by me and considered in my medical decision making (see chart for details).    MDM Rules/Calculators/A&P                          34 year old female history of rheumatoid arthritis presents today complaining of mild GI distress from taking diclofenac.  She states she would for her to be back on steroids.  She has some pain and swelling of the left lower extremity.  Subsequently Doppler ultrasound obtained of left lower extremity which does not show any evidence of DVT or other acute abnormality.  Plan discontinue diclofenac, will put on prednisone taper.  Patient advised to follow-up with primary care physician for further evaluation and treatment.  Patient advised of return precautions and voices understanding.  1- RA 2- anemia- patient with incidental finding of anemia.  Patient with known ho iron deficiency anemia.  Patient without significant symptoms- denies dyspnea, chest pain, light headedness.  Discussed low hgb and offered transfusion/admission.  Patient refuses.  Discussed need for iron and close follow up.  Patient states she will take iron today but states she cannot commit to taking it regularly.  Patient advised of severity of anemia and need for  treatment and voices understanding. 11:15 AM Patient now states she will have transfusion Also, discussed covid vaccine and patient states she will be getting it due to work. Final Clinical Impression(s) / ED Diagnoses Final diagnoses:  Rheumatoid arthritis involving multiple sites, unspecified whether rheumatoid factor present (HCC)  Iron deficiency  anemia, unspecified iron deficiency anemia type    Rx / DC Orders ED Discharge Orders    None       Margarita Grizzle, MD 04/24/20 1115

## 2020-04-24 NOTE — Discharge Instructions (Addendum)
Please take your iron  Follow up with your doctor in 3-5 days.

## 2020-04-24 NOTE — ED Notes (Signed)
CRITICAL VALUE ALERT  Critical Value:  Hgb 5.9  Date & Time Notied:  04/24/2020  1045  Provider Notified: Rosalia Hammers MD   Orders Received/Actions taken: MD aware

## 2020-04-24 NOTE — ED Notes (Signed)
An After Visit Summary was printed and given to the patient. Discharge instructions given and no further questions at this time.  

## 2020-04-24 NOTE — Progress Notes (Signed)
Left lower extremity venous duplex has been completed. Preliminary results can be found in CV Proc through chart review.  Results were given to Dr. Lynelle Doctor.  04/24/20 10:10 AM Olen Cordial RVT

## 2020-04-25 LAB — BPAM RBC
Blood Product Expiration Date: 202109072359
ISSUE DATE / TIME: 202108091307
Unit Type and Rh: 5100

## 2020-04-25 LAB — TYPE AND SCREEN
ABO/RH(D): O POS
Antibody Screen: NEGATIVE
Unit division: 0

## 2020-07-17 ENCOUNTER — Emergency Department (HOSPITAL_COMMUNITY): Payer: Medicaid Other

## 2020-07-17 ENCOUNTER — Other Ambulatory Visit: Payer: Self-pay

## 2020-07-17 ENCOUNTER — Encounter (HOSPITAL_COMMUNITY): Payer: Self-pay

## 2020-07-17 ENCOUNTER — Inpatient Hospital Stay (HOSPITAL_COMMUNITY)
Admission: EM | Admit: 2020-07-17 | Discharge: 2020-07-21 | DRG: 804 | Disposition: A | Discharge status: Home / Self Care | Payer: Medicaid Other | Attending: Internal Medicine | Admitting: Internal Medicine

## 2020-07-17 DIAGNOSIS — D649 Anemia, unspecified: Secondary | ICD-10-CM | POA: Diagnosis present

## 2020-07-17 DIAGNOSIS — D509 Iron deficiency anemia, unspecified: Principal | ICD-10-CM | POA: Diagnosis present

## 2020-07-17 DIAGNOSIS — Z20822 Contact with and (suspected) exposure to covid-19: Secondary | ICD-10-CM | POA: Diagnosis present

## 2020-07-17 DIAGNOSIS — M797 Fibromyalgia: Secondary | ICD-10-CM | POA: Diagnosis present

## 2020-07-17 DIAGNOSIS — R9389 Abnormal findings on diagnostic imaging of other specified body structures: Secondary | ICD-10-CM | POA: Diagnosis present

## 2020-07-17 DIAGNOSIS — Z87891 Personal history of nicotine dependence: Secondary | ICD-10-CM

## 2020-07-17 DIAGNOSIS — K5900 Constipation, unspecified: Secondary | ICD-10-CM | POA: Diagnosis present

## 2020-07-17 DIAGNOSIS — R0902 Hypoxemia: Secondary | ICD-10-CM

## 2020-07-17 DIAGNOSIS — M069 Rheumatoid arthritis, unspecified: Secondary | ICD-10-CM | POA: Diagnosis present

## 2020-07-17 DIAGNOSIS — R591 Generalized enlarged lymph nodes: Secondary | ICD-10-CM

## 2020-07-17 DIAGNOSIS — E876 Hypokalemia: Secondary | ICD-10-CM | POA: Diagnosis present

## 2020-07-17 DIAGNOSIS — R0781 Pleurodynia: Secondary | ICD-10-CM

## 2020-07-17 DIAGNOSIS — R0600 Dyspnea, unspecified: Secondary | ICD-10-CM

## 2020-07-17 DIAGNOSIS — R0602 Shortness of breath: Secondary | ICD-10-CM

## 2020-07-17 DIAGNOSIS — R590 Localized enlarged lymph nodes: Secondary | ICD-10-CM | POA: Diagnosis present

## 2020-07-17 LAB — BASIC METABOLIC PANEL
Anion gap: 6 (ref 5–15)
BUN: 7 mg/dL (ref 6–20)
CO2: 22 mmol/L (ref 22–32)
Calcium: 8.5 mg/dL — ABNORMAL LOW (ref 8.9–10.3)
Chloride: 107 mmol/L (ref 98–111)
Creatinine, Ser: 0.61 mg/dL (ref 0.44–1.00)
GFR, Estimated: >60 mL/min (ref >60)
Glucose, Bld: 100 mg/dL — ABNORMAL HIGH (ref 70–99)
Potassium: 3.5 mmol/L (ref 3.5–5.1)
Sodium: 135 mmol/L (ref 135–145)

## 2020-07-17 LAB — PROTIME-INR
INR: 1.2 (ref 0.8–1.2)
Prothrombin Time: 15.2 s (ref 11.4–15.2)

## 2020-07-17 LAB — CBC WITH DIFFERENTIAL/PLATELET
Abs Immature Granulocytes: 0.03 10*3/uL (ref 0.00–0.07)
Basophils Absolute: 0 10*3/uL (ref 0.0–0.1)
Basophils Relative: 0 %
Eosinophils Absolute: 0.1 10*3/uL (ref 0.0–0.5)
Eosinophils Relative: 1 %
HCT: 23.9 % — ABNORMAL LOW (ref 36.0–46.0)
Hemoglobin: 6.3 g/dL — CL (ref 12.0–15.0)
Immature Granulocytes: 0 %
Lymphocytes Relative: 12 %
Lymphs Abs: 0.8 10*3/uL (ref 0.7–4.0)
MCH: 18.3 pg — ABNORMAL LOW (ref 26.0–34.0)
MCHC: 26.4 g/dL — ABNORMAL LOW (ref 30.0–36.0)
MCV: 69.5 fL — ABNORMAL LOW (ref 80.0–100.0)
Monocytes Absolute: 0.3 10*3/uL (ref 0.1–1.0)
Monocytes Relative: 5 %
Neutro Abs: 5.7 10*3/uL (ref 1.7–7.7)
Neutrophils Relative %: 82 %
Platelets: 262 10*3/uL (ref 150–400)
RBC: 3.44 MIL/uL — ABNORMAL LOW (ref 3.87–5.11)
RDW: 17.3 % — ABNORMAL HIGH (ref 11.5–15.5)
WBC: 6.9 10*3/uL (ref 4.0–10.5)
nRBC: 0 % (ref 0.0–0.2)

## 2020-07-17 LAB — CBC
HCT: 24.4 % — ABNORMAL LOW (ref 36.0–46.0)
Hemoglobin: 6.7 g/dL — CL (ref 12.0–15.0)
MCH: 18.8 pg — ABNORMAL LOW (ref 26.0–34.0)
MCHC: 27.5 g/dL — ABNORMAL LOW (ref 30.0–36.0)
MCV: 68.5 fL — ABNORMAL LOW (ref 80.0–100.0)
Platelets: 302 10*3/uL (ref 150–400)
RBC: 3.56 MIL/uL — ABNORMAL LOW (ref 3.87–5.11)
RDW: 17.7 % — ABNORMAL HIGH (ref 11.5–15.5)
WBC: 8.3 10*3/uL (ref 4.0–10.5)
nRBC: 0 % (ref 0.0–0.2)

## 2020-07-17 LAB — I-STAT BETA HCG BLOOD, ED (MC, WL, AP ONLY): I-stat hCG, quantitative: <5 m[IU]/mL (ref <5)

## 2020-07-17 LAB — TROPONIN I (HIGH SENSITIVITY)
Troponin I (High Sensitivity): <2 ng/L (ref <18)
Troponin I (High Sensitivity): <2 ng/L (ref <18)

## 2020-07-17 LAB — HEPATIC FUNCTION PANEL
ALT: 9 U/L (ref 0–44)
ALT: 9 U/L (ref 0–44)
AST: 24 U/L (ref 15–41)
AST: 16 U/L (ref 15–41)
Albumin: 3.3 g/dL — ABNORMAL LOW (ref 3.5–5.0)
Albumin: 2.7 g/dL — ABNORMAL LOW (ref 3.5–5.0)
Alkaline Phosphatase: 61 U/L (ref 38–126)
Alkaline Phosphatase: 49 U/L (ref 38–126)
Bilirubin, Direct: <0.1 mg/dL (ref 0.0–0.2)
Bilirubin, Direct: <0.1 mg/dL (ref 0.0–0.2)
Total Bilirubin: 0.6 mg/dL (ref 0.3–1.2)
Total Bilirubin: 0.6 mg/dL (ref 0.3–1.2)
Total Protein: 8.3 g/dL — ABNORMAL HIGH (ref 6.5–8.1)
Total Protein: 6.9 g/dL (ref 6.5–8.1)

## 2020-07-17 LAB — IRON AND TIBC
Iron: 12 ug/dL — ABNORMAL LOW (ref 28–170)
Saturation Ratios: 4 % — ABNORMAL LOW (ref 10.4–31.8)
TIBC: 291 ug/dL (ref 250–450)
UIBC: 279 ug/dL

## 2020-07-17 LAB — RESPIRATORY PANEL BY RT PCR (FLU A&B, COVID)
Influenza A by PCR: NEGATIVE
Influenza B by PCR: NEGATIVE
SARS Coronavirus 2 by RT PCR: NEGATIVE

## 2020-07-17 LAB — APTT: aPTT: 32 s (ref 24–36)

## 2020-07-17 LAB — LACTIC ACID, PLASMA
Lactic Acid, Venous: 0.8 mmol/L (ref 0.5–1.9)
Lactic Acid, Venous: 0.8 mmol/L (ref 0.5–1.9)

## 2020-07-17 LAB — CK: Total CK: 13 U/L — ABNORMAL LOW (ref 38–234)

## 2020-07-17 LAB — RETICULOCYTES
Immature Retic Fract: 11.9 % (ref 2.3–15.9)
RBC.: 3.37 MIL/uL — ABNORMAL LOW (ref 3.87–5.11)
Retic Count, Absolute: 35 10*3/uL (ref 19.0–186.0)
Retic Ct Pct: 1 % (ref 0.4–3.1)

## 2020-07-17 LAB — LACTATE DEHYDROGENASE: LDH: 163 U/L (ref 98–192)

## 2020-07-17 LAB — C-REACTIVE PROTEIN: CRP: 4.7 mg/dL — ABNORMAL HIGH (ref <1.0)

## 2020-07-17 LAB — SEDIMENTATION RATE: Sed Rate: 59 mm/hr — ABNORMAL HIGH (ref 0–22)

## 2020-07-17 LAB — PHOSPHORUS: Phosphorus: 3.2 mg/dL (ref 2.5–4.6)

## 2020-07-17 LAB — FOLATE: Folate: 12 ng/mL

## 2020-07-17 LAB — FERRITIN: Ferritin: 5 ng/mL — ABNORMAL LOW (ref 11–307)

## 2020-07-17 LAB — VITAMIN B12: Vitamin B-12: 184 pg/mL (ref 180–914)

## 2020-07-17 LAB — PREPARE RBC (CROSSMATCH)

## 2020-07-17 LAB — MAGNESIUM: Magnesium: 1.7 mg/dL (ref 1.7–2.4)

## 2020-07-17 MED ORDER — SODIUM CHLORIDE 0.9% IV SOLUTION: Freq: Once | INTRAVENOUS | Status: DC

## 2020-07-17 MED ORDER — SODIUM CHLORIDE 0.9 % IV SOLN
75.0000 mL/h | INTRAVENOUS | Status: AC
  Administered 2020-07-18: 75 mL/h via INTRAVENOUS

## 2020-07-17 MED ORDER — FENTANYL CITRATE (PF) 100 MCG/2ML IJ SOLN
50.0000 ug | Freq: Once | INTRAMUSCULAR | Status: AC
  Administered 2020-07-17: 50 ug via INTRAVENOUS
  Filled 2020-07-17: qty 2

## 2020-07-17 MED ORDER — ACETAMINOPHEN 325 MG PO TABS
650.0000 mg | ORAL_TABLET | Freq: Once | ORAL | Status: DC
  Filled 2020-07-17: qty 2

## 2020-07-17 MED ORDER — FENTANYL CITRATE (PF) 100 MCG/2ML IJ SOLN
100.0000 ug | Freq: Once | INTRAMUSCULAR | Status: AC
  Administered 2020-07-17: 100 ug via INTRAVENOUS
  Filled 2020-07-17: qty 2

## 2020-07-17 MED ORDER — SODIUM CHLORIDE 0.9 % IV BOLUS
1000.0000 mL | Freq: Once | INTRAVENOUS | Status: AC
  Administered 2020-07-17: 1000 mL via INTRAVENOUS

## 2020-07-17 MED ORDER — ACETAMINOPHEN 650 MG RE SUPP: 650.0000 mg | Freq: Four times a day (QID) | RECTAL | Status: DC | PRN

## 2020-07-17 MED ORDER — HYDROCODONE-ACETAMINOPHEN 5-325 MG PO TABS
1.0000 | ORAL_TABLET | ORAL | Status: DC | PRN
  Administered 2020-07-18 – 2020-07-21 (×7): 2 via ORAL
  Filled 2020-07-17 (×6): qty 2
  Filled 2020-07-17: qty 1
  Filled 2020-07-17 (×3): qty 2

## 2020-07-17 MED ORDER — IOHEXOL 350 MG/ML SOLN
75.0000 mL | Freq: Once | INTRAVENOUS | Status: AC | PRN
  Administered 2020-07-17: 75 mL via INTRAVENOUS

## 2020-07-17 MED ORDER — ACETAMINOPHEN 325 MG PO TABS
650.0000 mg | ORAL_TABLET | Freq: Four times a day (QID) | ORAL | Status: DC | PRN
  Administered 2020-07-18: 650 mg via ORAL
  Filled 2020-07-17: qty 2

## 2020-07-17 NOTE — ED Notes (Signed)
Patient transported from CT 

## 2020-07-17 NOTE — ED Triage Notes (Signed)
Pt presents with Right side chest pain starting Saturday. States she woke up in a "pool of sweat, went to work and then had pains." pt seen at [facility] this am, they sent her here after abnormal blood results.

## 2020-07-17 NOTE — H&P (Addendum)
Katie Middleton ZOX:[id number] DOB: [birth year] DOA: 07/17/2020     PCP: Patient, No Pcp Per   Outpatient Specialists:  NONE    Patient arrived to ER on 07/17/20 at 1518 Referred by Attending Gwyneth Sprout, MD   Patient coming from: home Lives   With family   Chief Complaint:  Chief Complaint  Patient presents with  . Chest Pain    HPI: Katie Middleton is a 34 y.o. female with medical history significant of  Anemia sp multiple transfusions, possible Lyme disease, arthritis    Presented with  chest pain on the right of her chest worse with movement and deep breathing. Patient reports for the past year she has been off and on on prednisone for undiagnosed arthritis.  Patient states she has been having pain in her wrists and ankles.  Somebody suggested to her she may have rheumatoid arthritis since then she has been taking prednisone when somebody prescribes it to her usually obtained from the emergency department. She has been off of prednisone for at least a month. Patient reports she has noticed lymph nodes under her armpit for quite some time but have not paid much attention to them. Reports today she have had an episode of diaphoresis and generally not feeling well. No fevers or chills.  No cough Patient used to work for health system. Lyme disease 06/05/2017   Review of records in 2020 patient had IgM bands positive, along with negative IgG bands, which has been pretty consistent over the past 2+ years, last being assess by Ronda Fairly, MD and completed 4-week course of doxycycline in late 2018. She had what was felt to be false positive RPR, given negative TPPA, and negative Ehrlichia/Anaplasma PCR, and Spotted fever serology IgG was 1:64, negative IgM serology, with mildly positive ANA, cytoplasmic staining noted, negative RF. she has been diagnosed with Fibromyalgia, however has yet to establish care with a PCP, however has been to ED/Urgent Care multiple times 12/25/18,  01/06/19, 01/25/19, 02/12/19 for arthralgias/myalgias, and had been referred to rheumatology, however was seen after being on steroids for almost 62-months at which time she did not have any appreciated synovitis, or joint effusions, and was instructed to return if any did return.   She has a follow up with rheumatology in 2 days  Patient does states she has chronic anemia requiring blood transfusions in the past she does endorse heavy menstrual periods. She is supposed to be on iron supplementation but has not been taking it regularly. Patient was seen in August in the emergency department and was noted to have hemoglobin down to 5.9 apparently was transfused   Infectious risk factors:   shortness of breath,  Reports chest pain,  Has  been vaccinated against COVID    Initial COVID TEST  NEGATIVE   Lab Results  Component Value Date   SARSCOV2NAA NEGATIVE 07/17/2020   Regarding pertinent Chronic problems:       Chronic anemia - baseline hg Hemoglobin & Hematocrit  Recent Labs    04/24/20 1010 07/17/20 1537  HGB 5.9* 6.7*    While in ER: CT showing possible Lymphoma   Hospitalist was called for admission for  Abnormal Chest CT  The following Work up has been ordered so far:  Orders Placed This Encounter  Procedures  . Blood culture (routine single)  . Urine culture  . Respiratory Panel by RT PCR (Flu A&B, Covid) - Nasopharyngeal Swab  . DG Chest 2 View  . CT Angio Chest PE  W/Cm &/Or Wo Cm  . Basic metabolic panel  . CBC  . Lactic acid, plasma  . Protime-INR  . APTT  . Urinalysis, Routine w reflex microscopic  . Hepatic function panel  . Protime-INR  . APTT  . Hepatic function panel  . CBC with Differential  . Lactate dehydrogenase  . CK  . Magnesium  . Phosphorus  . Diet NPO time specified  . Document Height and Actual Weight  . Cardiac monitoring  . Document height and weight  . Assess and Document Glasgow Coma Scale  . Consult for Unassigned Medical Admission   ALL PATIENTS BEING ADMITTED/HAVING PROCEDURES NEED COVID-19 SCREENING 5333  . Pulse oximetry, continuous  . I-Stat beta hCG blood, ED  . ED EKG  . EKG 12-Lead  . Type and screen [facility]  . Insert peripheral IV X 1     Following Medications were ordered in ER: Medications  fentaNYL (SUBLIMAZE) injection 100 mcg (100 mcg Intravenous Given 07/17/20 1759)  sodium chloride 0.9 % bolus 1,000 mL (0 mLs Intravenous Stopped 07/17/20 2043)  iohexol (OMNIPAQUE) 350 MG/ML injection 75 mL (75 mLs Intravenous Contrast Given 07/17/20 1906)  fentaNYL (SUBLIMAZE) injection 50 mcg (50 mcg Intravenous Given 07/17/20 2057)      Significant initial  Findings: Abnormal Labs Reviewed  BASIC METABOLIC PANEL - Abnormal; Notable for the following components:      Result Value   Glucose, Bld 100 (*)    Calcium 8.5 (*)    All other components within normal limits  CBC - Abnormal; Notable for the following components:   RBC 3.56 (*)    Hemoglobin 6.7 (*)    HCT 24.4 (*)    MCV 68.5 (*)    MCH 18.8 (*)    MCHC 27.5 (*)    RDW 17.7 (*)    All other components within normal limits  HEPATIC FUNCTION PANEL - Abnormal; Notable for the following components:   Total Protein 8.3 (*)    Albumin 3.3 (*)    All other components within normal limits    Otherwise labs showing:    Recent Labs  Lab 07/17/20 1537  NA 135  K 3.5  CO2 22  GLUCOSE 100*  BUN 7  CREATININE 0.61  CALCIUM 8.5*    Cr  stable,    Lab Results  Component Value Date   CREATININE 0.61 07/17/2020   CREATININE 0.66 04/24/2020   CREATININE 0.56 12/25/2018    Recent Labs  Lab 07/17/20 1755  AST 24  ALT 9  ALKPHOS 61  BILITOT 0.6  PROT 8.3*  ALBUMIN 3.3*   Lab Results  Component Value Date   CALCIUM 8.5 (L) 07/17/2020        WBC    Component Value Date/Time   WBC 8.3 07/17/2020 1537   LYMPHSABS 1.0 12/25/2018 0058   MONOABS 0.1 12/25/2018 0058   EOSABS 0.3 12/25/2018 0058   BASOSABS 0.0 12/25/2018  0058    Plt: Lab Results  Component Value Date   PLT 302 07/17/2020   Lactic Acid, Venous    Component Value Date/Time   LATICACIDVEN 0.8 07/17/2020 1755       HG/HCT        Component Value Date/Time   HGB 6.7 (LL) 07/17/2020 1537   HCT 24.4 (L) 07/17/2020 1537   MCV 68.5 (L) 07/17/2020 1537   Troponin<1   ECG: Ordered Personally reviewed by me showing: HR : 109  Rhythm:  Sinus tachycardia   no  evidence of ischemic changes QTC 433    UA ordered   Urine analysis:    Component Value Date/Time   COLORURINE YELLOW 12/25/2018 0058   APPEARANCEUR CLEAR 12/25/2018 0058   LABSPEC 1.020 12/25/2018 0058   PHURINE 6.0 12/25/2018 0058   GLUCOSEU NEGATIVE 12/25/2018 0058   HGBUR NEGATIVE 12/25/2018 0058   BILIRUBINUR NEGATIVE 12/25/2018 0058   KETONESUR NEGATIVE 12/25/2018 0058   PROTEINUR NEGATIVE 12/25/2018 0058   UROBILINOGEN 1.0 04/05/2014 0841   NITRITE NEGATIVE 12/25/2018 0058   LEUKOCYTESUR NEGATIVE 12/25/2018 0058      Ordered   CXR -  NON acute   CTA chest - multiple lymphnodes likely Lymphoma    ED Triage Vitals  Enc Vitals Group     BP 07/17/20 1524 109/66     Pulse Rate 07/17/20 1524 (!) 117     Resp 07/17/20 1524 16     Temp 07/17/20 1524 (!) 101.4 F (38.6 C)     Temp Source 07/17/20 1524 Oral     SpO2 07/17/20 1524 99 %     Weight 07/17/20 1525 140 lb (63.5 kg)     Height 07/17/20 1525 5\' 3"  (1.6 m)     Head Circumference --      Peak Flow --      Pain Score 07/17/20 1525 0     Pain Loc --      Pain Edu? --      Excl. in GC? --   TMAX(24)@       Latest  Blood pressure 111/65, pulse 96, temperature (!) 101.4 F (38.6 C), temperature source Oral, resp. rate 20, height 5\' 3"  (1.6 m), weight 63.5 kg, last menstrual period 06/23/2020, SpO2 100 %.   Review of Systems:    Pertinent positives include:   Fatigue, joint pain  Constitutional:  No weight loss, night sweats, Fevers, chills,, weight loss  HEENT:  No headaches, Difficulty  swallowing,Tooth/dental problems,Sore throat,  No sneezing, itching, ear ache, nasal congestion, post nasal drip,  Cardio-vascular:  No chest pain, Orthopnea, PND, anasarca, dizziness, palpitations.no Bilateral lower extremity swelling  GI:  No heartburn, indigestion, abdominal pain, nausea, vomiting, diarrhea, change in bowel habits, loss of appetite, melena, blood in stool, hematemesis Resp:  no shortness of breath at rest. No dyspnea on exertion, No excess mucus, no productive cough, No non-productive cough, No coughing up of blood.No change in color of mucus.No wheezing. Skin:  no rash or lesions. No jaundice GU:  no dysuria, change in color of urine, no urgency or frequency. No straining to urinate.  No flank pain.  Musculoskeletal:  No joint pain or no joint swelling. No decreased range of motion. No back pain.  Psych:  No change in mood or affect. No depression or anxiety. No memory loss.  Neuro: no localizing neurological complaints, no tingling, no weakness, no double vision, no gait abnormality, no slurred speech, no confusion  All systems reviewed and apart from HOPI all are negative  Past Medical History:   Past Medical History:  Diagnosis Date  . Anemia   . Lyme disease   . Rheumatoid arthritis [facility])       Past Surgical History:  Procedure Laterality Date  . CESAREAN SECTION    . TUBAL LIGATION      Social History:  Ambulatory   Independently      reports that she quit smoking about 19 months ago. Her smoking use included cigarettes. She has never used smokeless tobacco. She reports current alcohol use.  She reports that she does not use drugs.   Family History:   Family History  Problem Relation Age of Onset  . Heart attack Father     Allergies: Allergies  Allergen Reactions  . Bactrim [Sulfamethoxazole-Trimethoprim] Other (See Comments)    "BLE hurts really, really bad"  . Penicillins Anaphylaxis    Did it involve swelling of the  face/tongue/throat, SOB, or low BP? Unknown Did it involve sudden or severe rash/hives, skin peeling, or any reaction on the inside of your mouth or nose? Unknown Did you need to seek medical attention at a hospital or doctor's office? Unknown When did it last happen?at infancy If all above answers are "NO", may proceed with cephalosporin use.    Marland Kitchen Amoxicillin   . Bactroban [Mupirocin Calcium] Hives     Prior to Admission medications   Medication Sig Start Date End Date Taking? Authorizing Provider  diclofenac (VOLTAREN) 75 MG EC tablet Take 75 mg by mouth 2 (two) times daily. 02/12/19   [provider]  predniSONE (STERAPRED UNI-PAK 21 TAB) 10 MG (21) TBPK tablet Take per package instructions 04/24/20   Margarita Grizzle, MD   Physical Exam: Vitals with BMI 07/17/2020 07/17/2020 07/17/2020  Height - - -  Weight - - -  BMI - - -  Systolic 111 112 915  Diastolic 65 70 56  Pulse 96 95 84     1. General:  in No Acute distress   Chronically ill  -appearing 2. Psychological: Alert and Oriented 3. Head/ENT:   Moist   Mucous Membranes                          Head Non traumatic, neck supple                           Poor Dentition 4. SKIN: normal  Skin turgor,  Skin clean Dry and intact no rash 5. Heart: Regular rate and rhythm no  Murmur, no Rub or gallop 6. Lungs:   no wheezes or crackles   7. Abdomen: Soft,  non-tender, Non distended bowel sounds present 8. Lower extremities: no clubbing, cyanosis, no  edema 9. Neurologically Grossly intact, moving all 4 extremities equally  10. MSK: Normal range of motion, no joint swelling noted   All other LABS:     Recent Labs  Lab 07/17/20 1537  WBC 8.3  HGB 6.7*  HCT 24.4*  MCV 68.5*  PLT 302     Recent Labs  Lab 07/17/20 1537  NA 135  K 3.5  CL 107  CO2 22  GLUCOSE 100*  BUN 7  CREATININE 0.61  CALCIUM 8.5*     Recent Labs  Lab 07/17/20 1755  AST 24  ALT 9  ALKPHOS 61  BILITOT 0.6  PROT 8.3*    ALBUMIN 3.3*      Cultures:    Component Value Date/Time   SDES URINE, RANDOM 01/10/2017 0937   SPECREQUEST NONE 01/10/2017 0937   CULT MULTIPLE SPECIES PRESENT, SUGGEST RECOLLECTION (A) 01/10/2017 0937   REPTSTATUS 01/11/2017 FINAL 01/10/2017 0937     Radiological Exams on Admission: DG Chest 2 View  Result Date: 07/17/2020 CLINICAL DATA:  Chest pain. EXAM: CHEST - 2 VIEW COMPARISON:  July 17, 2020. FINDINGS: The heart size and mediastinal contours are within normal limits. Both lungs are clear. No visible pleural effusions or pneumothorax. The visualized skeletal structures are unremarkable. IMPRESSION: No active  cardiopulmonary disease. Electronically Signed   By: Feliberto Harts MD   On: 07/17/2020 15:59   CT Angio Chest PE W/Cm &/Or Wo Cm  Result Date: 07/17/2020 CLINICAL DATA:  Right-sided chest pain since Saturday. EXAM: CT ANGIOGRAPHY CHEST WITH CONTRAST TECHNIQUE: Multidetector CT imaging of the chest was performed using the standard protocol during bolus administration of intravenous contrast. Multiplanar CT image reconstructions and MIPs were obtained to evaluate the vascular anatomy. CONTRAST:  67mL OMNIPAQUE IOHEXOL 350 MG/ML SOLN COMPARISON:  Chest x-ray from same day. CTA chest dated October 20, 2011. FINDINGS: Cardiovascular: Satisfactory opacification of the pulmonary arteries to the segmental level. No evidence of pulmonary embolism. Normal heart size. No pericardial effusion. No thoracic aortic aneurysm or dissection. Mediastinum/Nodes: New multiple enlarged bilateral axillary and subpectoral lymph nodes measuring up to 1.4 cm in short axis on the right and 1.6 cm on the left. No enlarged mediastinal or hilar lymph nodes. The thyroid gland, trachea, and esophagus demonstrate no significant findings. Lungs/Pleura: Trace right pleural effusion with minimal subsegmental atelectasis in the right lower lobe. No focal consolidation or pneumothorax. Upper Abdomen: No acute  abnormality. Musculoskeletal: No chest wall abnormality. No acute or significant osseous findings. Review of the MIP images confirms the above findings. IMPRESSION: 1. New multiple enlarged bilateral axillary and subpectoral lymph nodes measuring up to 1.6 cm, concerning for lymphoma. These are amenable to tissue sampling. 2. No evidence of pulmonary embolism. 3. Trace right pleural effusion. Electronically Signed   By: Obie Dredge M.D.   On: 07/17/2020 19:33    Chart has been reviewed    Assessment/Plan   34 y.o. female with medical history significant of  Anemia, multijoint arthritis unclear etiology, remote history of Lyme disease/fibromyalgia admitted for abnormal Chest CT work-up for possible lymphoma Present on Admission: . Abnormal CT of the chest -unclear etiology, appreciate interventional radiology consult to help with the diagnostics.  No need to have follow-up with oncology if evidence of malignancy   . Anemia symptomatic anemia we will transfuse obtain anemia panel If evidence of iron deficiency most likely secondary to heavy menses.  For which she probably needs to get follow-up with OB/GYN  Arthritis unclear etiology -seems to be steroid responsive but would need to further define the cause is chronic use of steroids could be masking underlying disease process Check sed rate CRP ANA rheumatoid factor Patient to follow-up with rheumatology as an outpatient Hold off on steroids for now Per patient she has been off steroids for at least a month now  Chest pain unclear etiology atypical most likely musculoskeletal related CT unremarkable EKG nonischemic troponin within normal limits Other plan as per orders.  DVT prophylaxis:  SCD       Code Status:    Code Status: Prior FULL CODE  as per patient  I had personally discussed CODE STATUS with patient       Family Communication:   Family not at  Bedside    Disposition Plan:      To home once workup is complete and  patient is stable   Following barriers for discharge:                            Electrolytes corrected                               Anemia corrected  Pain controlled with PO medications                                                          Will need consultants to evaluate patient prior to discharge    Consults called: [facility] Dr. Pamelia Hoit  Admission status:  ED Disposition    ED Disposition Condition Comment   Admit  Hospital Area: [facility] [[id number]]  Level of Care: Telemetry Medical [104]  I expect the patient will be discharged within 24 hours: No (not a candidate for 5C-Observation unit)  Covid Evaluation: Confirmed COVID Negative  Diagnosis: Abnormal CT of the chest [161096]  Admitting Physician: Therisa Doyne [[id number]]  Attending Physician: Therisa Doyne [[id number]]        Obs     Level of care   tele  For   24H        Lab Results  Component Value Date   SARSCOV2NAA NEGATIVE 07/17/2020     Precautions: admitted as  Covid Negative     PPE: Used by the provider:   P100  eye Goggles,  Gloves    Kenton Fortin 07/17/2020, 10:33 PM    [facility]     after 2 AM please page floor coverage PA If 7AM-7PM, please contact the day team taking care of the patient using [email or web address]   Patient was evaluated in the context of the global COVID-19 pandemic, which necessitated consideration that the patient might be at risk for infection with the SARS-CoV-2 virus that causes COVID-19. Institutional protocols and algorithms that pertain to the evaluation of patients at risk for COVID-19 are in a state of rapid change based on information released by regulatory bodies including the CDC and federal and state organizations. These policies and algorithms were followed during the patient's care.

## 2020-07-17 NOTE — ED Notes (Signed)
Patient transported to CT 

## 2020-07-17 NOTE — ED Provider Notes (Signed)
[facility] EMERGENCY DEPARTMENT Provider Note   CSN: [id number] Arrival date & time: 07/17/20  1518     History Chief Complaint  Patient presents with  . Chest Pain    Katie Middleton is a 34 y.o. female with pertinent past medical history of anemia from chronic menstrual cycle blood loss, Lyme disease, rheumatoid arthritis intermittently on prednisone, not currently on prednisone that presents emergency department today for chest pain and shortness of breath.  Patient states that since Saturday she has had pleuritic chest pain that has been constant.  Does not radiate anywhere, is severe.  Is not worse upon exertion.  States that she woke up in a pool of sweat on Saturday, has been worsening since then.  Has been vaccinated against Covid.  Also admits to a slight cough that started Saturday, both of her children are sick.  Denies any fevers at home, however does have a fever here today.  Denies any nausea, vomiting, abdominal pain.  Also admits to shortness of breath.  States that she has not been taking anything for this.  Patient is chronically anemic, normally gets iron transfusion and blood transfusions, states that she got one earlier this year.  States that this feels different than her shortness of breath from her anemia.  Denies any sick contacts besides her 2 children that have a slight cough.  Went to urgent care today who sent her here for elevated dimer.  HPI     Past Medical History:  Diagnosis Date  . Anemia   . Lyme disease   . Rheumatoid arthritis [facility])     Patient Active Problem List   Diagnosis Date Noted  . Symptomatic anemia 11/12/2018  . Iron deficiency anemia 11/12/2018  . Abdominal pain with vomiting 11/12/2018  . Fluid in endometrial cavity 11/12/2018    Past Surgical History:  Procedure Laterality Date  . CESAREAN SECTION    . TUBAL LIGATION       OB History    Gravida  4   Para  3   Term      Preterm      AB  1   Living         SAB      TAB      Ectopic      Multiple      Live Births              Family History  Problem Relation Age of Onset  . Heart attack Father     Social History   Tobacco Use  . Smoking status: Former Smoker    Types: Cigarettes    Quit date: 12/15/2018    Years since quitting: 1.5  . Smokeless tobacco: Never Used  Vaping Use  . Vaping Use: Never used  Substance Use Topics  . Alcohol use: Yes    Comment: occ  . Drug use: No    Home Medications Prior to Admission medications   Medication Sig Start Date End Date Taking? Authorizing Provider  diclofenac (VOLTAREN) 75 MG EC tablet Take 75 mg by mouth 2 (two) times daily. 02/12/19   [provider]  predniSONE (STERAPRED UNI-PAK 21 TAB) 10 MG (21) TBPK tablet Take per package instructions 04/24/20   Margarita Grizzle, MD    Allergies    Bactrim [sulfamethoxazole-trimethoprim], Penicillins, Amoxicillin, and Bactroban [mupirocin calcium]  Review of Systems   Review of Systems  Constitutional: Positive for fatigue and fever. Negative for chills and diaphoresis.  HENT:  Negative for congestion, drooling, sore throat and trouble swallowing.   Eyes: Negative for pain and visual disturbance.  Respiratory: Positive for cough and shortness of breath. Negative for wheezing.   Cardiovascular: Positive for chest pain. Negative for palpitations and leg swelling.  Gastrointestinal: Negative for abdominal distention, abdominal pain, diarrhea, nausea and vomiting.  Genitourinary: Negative for difficulty urinating.  Musculoskeletal: Negative for back pain, myalgias, neck pain and neck stiffness.  Skin: Negative for color change, pallor, rash and wound.  Neurological: Negative for dizziness, syncope, speech difficulty, weakness, light-headedness, numbness and headaches.  Psychiatric/Behavioral: Negative for behavioral problems and confusion.    Physical Exam Updated Vital Signs BP 111/65   Pulse 96   Temp (!) 101.4 F  (38.6 C) (Oral)   Resp 20   Ht 5\' 3"  (1.6 m)   Wt 63.5 kg   LMP 06/23/2020 (Approximate) Comment: Tubal Ligation.  TRK  SpO2 100%   BMI 24.80 kg/m   Physical Exam Constitutional:      General: She is in acute distress.     Appearance: Normal appearance. She is not ill-appearing, toxic-appearing or diaphoretic.     Comments: Appears uncomfortable on exam, holding chest.  Does appear sweaty and tachypneic, no accessory muscle use.  HENT:     Mouth/Throat:     Mouth: Mucous membranes are moist.     Pharynx: Oropharynx is clear.  Eyes:     General: No scleral icterus.    Extraocular Movements: Extraocular movements intact.     Pupils: Pupils are equal, round, and reactive to light.  Cardiovascular:     Rate and Rhythm: Regular rhythm. Tachycardia present.     Pulses: Normal pulses.     Heart sounds: Normal heart sounds.  Pulmonary:     Effort: Pulmonary effort is normal. Tachypnea present. No respiratory distress.     Breath sounds: Normal breath sounds. No stridor. No wheezing, rhonchi or rales.  Chest:     Chest wall: No tenderness.  Abdominal:     General: Abdomen is flat. There is no distension.     Palpations: Abdomen is soft.     Tenderness: There is no abdominal tenderness. There is no guarding or rebound.  Musculoskeletal:        General: No swelling or tenderness. Normal range of motion.     Cervical back: Normal range of motion and neck supple. No rigidity.     Right lower leg: No edema.     Left lower leg: No edema.  Skin:    General: Skin is warm and dry.     Capillary Refill: Capillary refill takes less than 2 seconds.     Coloration: Skin is not pale.  Neurological:     General: No focal deficit present.     Mental Status: She is alert and oriented to person, place, and time.  Psychiatric:        Mood and Affect: Mood normal.        Behavior: Behavior normal.     ED Results / Procedures / Treatments   Labs (all labs ordered are listed, but only  abnormal results are displayed) Labs Reviewed  BASIC METABOLIC PANEL - Abnormal; Notable for the following components:      Result Value   Glucose, Bld 100 (*)    Calcium 8.5 (*)    All other components within normal limits  CBC - Abnormal; Notable for the following components:   RBC 3.56 (*)    Hemoglobin 6.7 (*)  HCT 24.4 (*)    MCV 68.5 (*)    MCH 18.8 (*)    MCHC 27.5 (*)    RDW 17.7 (*)    All other components within normal limits  HEPATIC FUNCTION PANEL - Abnormal; Notable for the following components:   Total Protein 8.3 (*)    Albumin 3.3 (*)    All other components within normal limits  RESPIRATORY PANEL BY RT PCR (FLU A&B, COVID)  CULTURE, BLOOD (SINGLE)  URINE CULTURE  LACTIC ACID, PLASMA  PROTIME-INR  APTT  PROTIME-INR  APTT  LACTIC ACID, PLASMA  URINALYSIS, ROUTINE W REFLEX MICROSCOPIC  I-STAT BETA HCG BLOOD, ED ([facility], [facility], [facility] ONLY)  TYPE AND SCREEN  TROPONIN I (HIGH SENSITIVITY)  TROPONIN I (HIGH SENSITIVITY)    EKG EKG Interpretation  Date/Time:  Monday July 17 2020 15:19:39 EDT Ventricular Rate:  109 PR Interval:  158 QRS Duration: 80 QT Interval:  322 QTC Calculation: 433 R Axis:   57 Text Interpretation: Sinus tachycardia Otherwise normal ECG No significant change since last tracing Confirmed by Gwyneth Sprout ([id number]) on 07/17/2020 5:27:30 PM   Radiology DG Chest 2 View  Result Date: 07/17/2020 CLINICAL DATA:  Chest pain. EXAM: CHEST - 2 VIEW COMPARISON:  July 17, 2020. FINDINGS: The heart size and mediastinal contours are within normal limits. Both lungs are clear. No visible pleural effusions or pneumothorax. The visualized skeletal structures are unremarkable. IMPRESSION: No active cardiopulmonary disease. Electronically Signed   By: Feliberto Harts MD   On: 07/17/2020 15:59   CT Angio Chest PE W/Cm &/Or Wo Cm  Result Date: 07/17/2020 CLINICAL DATA:  Right-sided chest pain since Saturday. EXAM: CT ANGIOGRAPHY CHEST WITH CONTRAST  TECHNIQUE: Multidetector CT imaging of the chest was performed using the standard protocol during bolus administration of intravenous contrast. Multiplanar CT image reconstructions and MIPs were obtained to evaluate the vascular anatomy. CONTRAST:  76mL OMNIPAQUE IOHEXOL 350 MG/ML SOLN COMPARISON:  Chest x-ray from same day. CTA chest dated October 20, 2011. FINDINGS: Cardiovascular: Satisfactory opacification of the pulmonary arteries to the segmental level. No evidence of pulmonary embolism. Normal heart size. No pericardial effusion. No thoracic aortic aneurysm or dissection. Mediastinum/Nodes: New multiple enlarged bilateral axillary and subpectoral lymph nodes measuring up to 1.4 cm in short axis on the right and 1.6 cm on the left. No enlarged mediastinal or hilar lymph nodes. The thyroid gland, trachea, and esophagus demonstrate no significant findings. Lungs/Pleura: Trace right pleural effusion with minimal subsegmental atelectasis in the right lower lobe. No focal consolidation or pneumothorax. Upper Abdomen: No acute abnormality. Musculoskeletal: No chest wall abnormality. No acute or significant osseous findings. Review of the MIP images confirms the above findings. IMPRESSION: 1. New multiple enlarged bilateral axillary and subpectoral lymph nodes measuring up to 1.6 cm, concerning for lymphoma. These are amenable to tissue sampling. 2. No evidence of pulmonary embolism. 3. Trace right pleural effusion. Electronically Signed   By: Obie Dredge M.D.   On: 07/17/2020 19:33    Procedures Procedures (including critical care time)  Medications Ordered in ED Medications  fentaNYL (SUBLIMAZE) injection 100 mcg (100 mcg Intravenous Given 07/17/20 1759)  sodium chloride 0.9 % bolus 1,000 mL (0 mLs Intravenous Stopped 07/17/20 2043)  iohexol (OMNIPAQUE) 350 MG/ML injection 75 mL (75 mLs Intravenous Contrast Given 07/17/20 1906)  fentaNYL (SUBLIMAZE) injection 50 mcg (50 mcg Intravenous Given 07/17/20  2057)    ED Course  I have reviewed the triage vital signs and the nursing notes.  Pertinent labs &  imaging results that were available during my care of the patient were reviewed by me and considered in my medical decision making (see chart for details).    MDM Rules/Calculators/A&P                          Alizandra Loh is a 34 y.o. female with pertinent past medical history of anemia from chronic menstrual cycle blood loss, Lyme disease, rheumatoid arthritis intermittently on prednisone, not currently on prednisone that presents emergency department today for chest pain and shortness of breath. Differential to include pericardial effusion, tamponade, PE, Covid, sepsis.    Initial interventions fentanyl and fluid.  ECG interpreted by me demonstrated sinus tachycardia. CXR interpreted by me demonstrated no acute cardiopulmonary disease.  Labs demonstrated normal troponin, lactic acid normal, BMP unremarkable.  CBC does show hemoglobin of 6.7, this does appear better than patient's baseline.  Was 5.9 2 months ago, patient did not receive transfusion then.  Will hold off on blood at this time, do not suspect that symptoms are coming from symptomatic anemia.  CT scan suspicious for lymphoma, no evidence of PE.  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  Patient also needs to come in for pain control, still having severe chest pain after 2 doses of fentanyl given.  Do not want to attempt giving anything else at this time since pressures are soft.  Will consult hospital at this time.  913 Spoke to Dr. Adela Glimpse, hospitalist who will accept the patient.   The patient appears reasonably stabilized for admission considering the current resources, flow, and capabilities available in the ED at this time, and I doubt any other [facility] requiring  further screening and/or treatment in the ED prior to admission.  `I discussed this case with my attending physician who cosigned this note including patient's presenting symptoms, physical exam, and planned diagnostics and interventions. Attending physician stated agreement with plan or made changes to plan which were implemented.   Attending physician assessed patient at bedside.  Final Clinical Impression(s) / ED Diagnoses Final diagnoses:  Shortness of breath  Pleuritic chest pain  Abnormal CT of the chest    Rx / DC Orders ED Discharge Orders    None       Farrel Gordon, PA-C 07/17/20 2119    Gwyneth Sprout, MD 07/17/20 2235

## 2020-07-18 ENCOUNTER — Inpatient Hospital Stay (HOSPITAL_COMMUNITY): Payer: Medicaid Other

## 2020-07-18 ENCOUNTER — Encounter (HOSPITAL_COMMUNITY): Payer: Self-pay | Admitting: Internal Medicine

## 2020-07-18 DIAGNOSIS — R9389 Abnormal findings on diagnostic imaging of other specified body structures: Secondary | ICD-10-CM | POA: Diagnosis not present

## 2020-07-18 DIAGNOSIS — R0602 Shortness of breath: Secondary | ICD-10-CM

## 2020-07-18 DIAGNOSIS — R0781 Pleurodynia: Secondary | ICD-10-CM

## 2020-07-18 DIAGNOSIS — D649 Anemia, unspecified: Secondary | ICD-10-CM | POA: Diagnosis not present

## 2020-07-18 DIAGNOSIS — D509 Iron deficiency anemia, unspecified: Secondary | ICD-10-CM | POA: Diagnosis present

## 2020-07-18 DIAGNOSIS — M069 Rheumatoid arthritis, unspecified: Secondary | ICD-10-CM | POA: Diagnosis present

## 2020-07-18 DIAGNOSIS — Z20822 Contact with and (suspected) exposure to covid-19: Secondary | ICD-10-CM | POA: Diagnosis present

## 2020-07-18 DIAGNOSIS — K5900 Constipation, unspecified: Secondary | ICD-10-CM | POA: Diagnosis present

## 2020-07-18 DIAGNOSIS — R590 Localized enlarged lymph nodes: Secondary | ICD-10-CM | POA: Diagnosis present

## 2020-07-18 DIAGNOSIS — M797 Fibromyalgia: Secondary | ICD-10-CM | POA: Diagnosis present

## 2020-07-18 DIAGNOSIS — E876 Hypokalemia: Secondary | ICD-10-CM | POA: Diagnosis present

## 2020-07-18 DIAGNOSIS — R591 Generalized enlarged lymph nodes: Secondary | ICD-10-CM | POA: Diagnosis not present

## 2020-07-18 DIAGNOSIS — Z87891 Personal history of nicotine dependence: Secondary | ICD-10-CM | POA: Diagnosis not present

## 2020-07-18 LAB — COMPREHENSIVE METABOLIC PANEL
ALT: 9 U/L (ref 0–44)
AST: 18 U/L (ref 15–41)
Albumin: 2.7 g/dL — ABNORMAL LOW (ref 3.5–5.0)
Alkaline Phosphatase: 49 U/L (ref 38–126)
Anion gap: 7 (ref 5–15)
BUN: 5 mg/dL — ABNORMAL LOW (ref 6–20)
CO2: 21 mmol/L — ABNORMAL LOW (ref 22–32)
Calcium: 8 mg/dL — ABNORMAL LOW (ref 8.9–10.3)
Chloride: 108 mmol/L (ref 98–111)
Creatinine, Ser: 0.62 mg/dL (ref 0.44–1.00)
GFR, Estimated: >60 mL/min (ref >60)
Glucose, Bld: 94 mg/dL (ref 70–99)
Potassium: 3.4 mmol/L — ABNORMAL LOW (ref 3.5–5.1)
Sodium: 136 mmol/L (ref 135–145)
Total Bilirubin: 1.1 mg/dL (ref 0.3–1.2)
Total Protein: 7.1 g/dL (ref 6.5–8.1)

## 2020-07-18 LAB — CBC WITH DIFFERENTIAL/PLATELET
Abs Immature Granulocytes: 0.06 10*3/uL (ref 0.00–0.07)
Basophils Absolute: 0 10*3/uL (ref 0.0–0.1)
Basophils Relative: 0 %
Eosinophils Absolute: 0.1 10*3/uL (ref 0.0–0.5)
Eosinophils Relative: 1 %
HCT: 26.5 % — ABNORMAL LOW (ref 36.0–46.0)
Hemoglobin: 7.4 g/dL — ABNORMAL LOW (ref 12.0–15.0)
Immature Granulocytes: 1 %
Lymphocytes Relative: 8 %
Lymphs Abs: 0.7 10*3/uL (ref 0.7–4.0)
MCH: 19.8 pg — ABNORMAL LOW (ref 26.0–34.0)
MCHC: 27.9 g/dL — ABNORMAL LOW (ref 30.0–36.0)
MCV: 71 fL — ABNORMAL LOW (ref 80.0–100.0)
Monocytes Absolute: 0.4 10*3/uL (ref 0.1–1.0)
Monocytes Relative: 5 %
Neutro Abs: 7.3 10*3/uL (ref 1.7–7.7)
Neutrophils Relative %: 85 %
Platelets: 253 10*3/uL (ref 150–400)
RBC: 3.73 MIL/uL — ABNORMAL LOW (ref 3.87–5.11)
RDW: 18.4 % — ABNORMAL HIGH (ref 11.5–15.5)
WBC: 8.5 10*3/uL (ref 4.0–10.5)
nRBC: 0 % (ref 0.0–0.2)

## 2020-07-18 LAB — URINALYSIS, ROUTINE W REFLEX MICROSCOPIC
Bilirubin Urine: NEGATIVE
Glucose, UA: NEGATIVE mg/dL
Hgb urine dipstick: NEGATIVE
Ketones, ur: 5 mg/dL — AB
Leukocytes,Ua: NEGATIVE
Nitrite: NEGATIVE
Protein, ur: NEGATIVE mg/dL
Specific Gravity, Urine: 1.045 — ABNORMAL HIGH (ref 1.005–1.030)
pH: 6 (ref 5.0–8.0)

## 2020-07-18 LAB — TSH: TSH: 0.973 u[IU]/mL (ref 0.350–4.500)

## 2020-07-18 LAB — MAGNESIUM: Magnesium: 1.7 mg/dL (ref 1.7–2.4)

## 2020-07-18 LAB — PHOSPHORUS: Phosphorus: 3.3 mg/dL (ref 2.5–4.6)

## 2020-07-18 LAB — HIV ANTIBODY (ROUTINE TESTING W REFLEX): HIV Screen 4th Generation wRfx: NONREACTIVE

## 2020-07-18 MED ORDER — IOHEXOL 300 MG/ML  SOLN
100.0000 mL | Freq: Once | INTRAMUSCULAR | Status: AC | PRN
  Administered 2020-07-18: 100 mL via INTRAVENOUS

## 2020-07-18 MED ORDER — HYDROMORPHONE HCL 1 MG/ML IJ SOLN
1.0000 mg | INTRAMUSCULAR | Status: DC | PRN
  Administered 2020-07-18: 1 mg via INTRAVENOUS
  Filled 2020-07-18 (×2): qty 1

## 2020-07-18 MED ORDER — FUROSEMIDE 10 MG/ML IJ SOLN
40.0000 mg | Freq: Once | INTRAMUSCULAR | Status: DC
  Filled 2020-07-18: qty 4

## 2020-07-18 NOTE — ED Notes (Signed)
Pt vomited up 1.5 of the vicodin pills. Katie Middleton witnessed. Patient states unable to take large pills

## 2020-07-18 NOTE — ED Notes (Signed)
Patient on stretcher and monitors for transport up to 5C15. Report called to RN on floor. Patient with improved VS, and clearance from MD to go to floor at this time. Patient reports good pain relief. All belongings sent up with patient in labeled belonging bag. PIV is SL

## 2020-07-18 NOTE — Progress Notes (Signed)
PROGRESS NOTE    Katie Middleton  VXB:[id number] DOB: 24-Mar-1986 DOA: 07/17/2020 PCP: Patient, No Pcp Per  Brief Narrative: 34 year old female with history of polyarthritis of unclear etiology, chronic iron deficiency anemia from menorrhagia requiring intermittent blood transfusions presented to the ED overnight with right-sided chest pain, shortness of breath. -Patient has been having polyarthritis involving small joints of her hands, wrists, ankles off and on for years she has been on numerous brief courses of prednisone often prescribed by emergency room physicians.  She noticed lymph nodes in her axilla for a few months, this past week she has had a sore throat as well, previously had Lyme's disease treated with doxycycline in 2018. -Finally referred to rheumatology and she has an upcoming appointment at [facility]ology clinic tomorrow. -In the interim she was having more right-sided chest pain with dyspnea which prompted her to come to the emergency room -Work-up in the ED noted hemoglobin of 6.3 microcytic with severe iron deficiency and CT chest with new multiple enlarged bilateral axillary and subpectoral lymph nodes measuring up to 1.6 cm, raising concern for possible lymphoma, no PE.   Assessment & Plan:   Acute on chronic iron deficiency anemia -Transfusing 2 units of PRBC -Long history of menorrhagia since puberty -Advised gynecology follow-up for this -Consider IV iron infusion tomorrow  Cervical/axillary lymphadenopathy -Etiology unclear, differential is quite broad -Follow-up HIV -Also check EBV and CMV -Also check CT neck and abdomen pelvis -IR following peripherally   DVT prophylaxis: SCDs Code Status: Full code Family Communication: Discussed with patient in detail, no family at bedside Disposition Plan:  Status is: Inpatient  Remains inpatient appropriate because:Inpatient level of care appropriate due to severity of illness   Dispo: The patient is from:  Home              Anticipated d/c is to: Home              Anticipated d/c date is: 1-2 days              Patient currently is not medically stable to d/c.  Consultants:  IR   Procedures:   Antimicrobials:    Subjective: -Feels a little better, had chest discomfort yesterday, hurts all over  Objective: Vitals:   07/18/20 1215 07/18/20 1230 07/18/20 1245 07/18/20 1315  BP: 99/60 (!) 107/57 115/63 (!) 111/56  Pulse:   (!) 109 (!) 108  Resp: (!) 37 (!) 38 (!) 21 (!) 59  Temp:   99.5 F (37.5 C)   TempSrc:   Axillary   SpO2:   98% 100%  Weight:      Height:        Intake/Output Summary (Last 24 hours) at 07/18/2020 1346 Last data filed at 07/17/2020 2355 Gross per 24 hour  Intake 315 ml  Output --  Net 315 ml   Filed Weights   07/17/20 1525  Weight: 63.5 kg    Examination:  General exam: Pleasant thinly built female sitting up in bed, AAOx3 CVS: S1-S2, regular rhythm, tachycardic Lungs: Clear bilaterally Abdomen: Soft, nontender, bowel sounds present hepatomegaly noted  Extremities: Trace edema  Skin: Mild erythema over right foot Psychiatry: Mood & affect appropriate.     Data Reviewed:   CBC: Recent Labs  Lab 07/17/20 1537 07/17/20 2207 07/18/20 0500  WBC 8.3 6.9 8.5  NEUTROABS  --  5.7 7.3  HGB 6.7* 6.3* 7.4*  HCT 24.4* 23.9* 26.5*  MCV 68.5* 69.5* 71.0*  PLT 302 262 253  Basic Metabolic Panel: Recent Labs  Lab 07/17/20 1537 07/17/20 2207 07/18/20 0500  NA 135  --  136  K 3.5  --  3.4*  CL 107  --  108  CO2 22  --  21*  GLUCOSE 100*  --  94  BUN 7  --  5*  CREATININE 0.61  --  0.62  CALCIUM 8.5*  --  8.0*  MG  --  1.7 1.7  PHOS  --  3.2 3.3   GFR: Estimated Creatinine Clearance: 88.8 mL/min (by C-G formula based on SCr of 0.62 mg/dL). Liver Function Tests: Recent Labs  Lab 07/17/20 1755 07/17/20 2207 07/18/20 0500  AST 24 16 18   ALT 9 9 9   ALKPHOS 61 49 49  BILITOT 0.6 0.6 1.1  PROT 8.3* 6.9 7.1  ALBUMIN 3.3* 2.7* 2.7*    No results for input(s): LIPASE, AMYLASE in the last 168 hours. No results for input(s): AMMONIA in the last 168 hours. Coagulation Profile: Recent Labs  Lab 07/17/20 1755 07/17/20 1834  INR NOT CALCULATED 1.2   Cardiac Enzymes: Recent Labs  Lab 07/17/20 2207  CKTOTAL 13*   BNP (last 3 results) No results for input(s): PROBNP in the last 8760 hours. HbA1C: No results for input(s): HGBA1C in the last 72 hours. CBG: No results for input(s): GLUCAP in the last 168 hours. Lipid Profile: No results for input(s): CHOL, HDL, LDLCALC, TRIG, CHOLHDL, LDLDIRECT in the last 72 hours. Thyroid Function Tests: Recent Labs    07/18/20 0500  TSH 0.973   Anemia Panel: Recent Labs    07/17/20 2207  VITAMINB12 184  FOLATE 12.0  FERRITIN 5*  TIBC 291  IRON 12*  RETICCTPCT 1.0   Urine analysis:    Component Value Date/Time   COLORURINE YELLOW 07/18/2020 0135   APPEARANCEUR HAZY (A) 07/18/2020 0135   LABSPEC 1.045 (H) 07/18/2020 0135   PHURINE 6.0 07/18/2020 0135   GLUCOSEU NEGATIVE 07/18/2020 0135   HGBUR NEGATIVE 07/18/2020 0135   BILIRUBINUR NEGATIVE 07/18/2020 0135   KETONESUR 5 (A) 07/18/2020 0135   PROTEINUR NEGATIVE 07/18/2020 0135   UROBILINOGEN 1.0 04/05/2014 0841   NITRITE NEGATIVE 07/18/2020 0135   LEUKOCYTESUR NEGATIVE 07/18/2020 0135   Sepsis Labs: @LABRCNTIP (procalcitonin:4,lacticidven:4)  ) Recent Results (from the past 240 hour(s))  Blood culture (routine single)     Status: None (Preliminary result)   Collection Time: 07/17/20  5:36 PM   Specimen: BLOOD  Result Value Ref Range Status   Specimen Description BLOOD SITE NOT SPECIFIED  Final   Special Requests   Final    BOTTLES DRAWN AEROBIC AND ANAEROBIC Blood Culture adequate volume   Culture   Final    NO GROWTH < 12 HOURS Performed at [facility], [address], [place], [place] [address]    Report Status PENDING  Incomplete  Respiratory Panel by RT PCR (Flu A&B, Covid) - Nasopharyngeal  Swab     Status: None   Collection Time: 07/17/20  6:00 PM   Specimen: Nasopharyngeal Swab  Result Value Ref Range Status   SARS Coronavirus 2 by RT PCR NEGATIVE NEGATIVE Final    Comment: (NOTE) SARS-CoV-2 target nucleic acids are NOT DETECTED.  The SARS-CoV-2 RNA is generally detectable in upper respiratoy specimens during the acute phase of infection. The lowest concentration of SARS-CoV-2 viral copies this assay can detect is 131 copies/mL. A negative result does not preclude SARS-Cov-2 infection and should not be used as the sole basis for treatment or other patient  management decisions. A negative result may occur with  improper specimen collection/handling, submission of specimen other than nasopharyngeal swab, presence of viral mutation(s) within the areas targeted by this assay, and inadequate number of viral copies (<131 copies/mL). A negative result must be combined with clinical observations, patient history, and epidemiological information. The expected result is Negative.  Fact Sheet for Patients:  https://www.moore.com/  Fact Sheet for Healthcare Providers:  https://www.young.biz/  This test is no t yet approved or cleared by the [place] FDA and  has been authorized for detection and/or diagnosis of SARS-CoV-2 by FDA under an Emergency Use Authorization (EUA). This EUA will remain  in effect (meaning this test can be used) for the duration of the COVID-19 declaration under Section 564(b)(1) of the Act, 21 U.S.C. section 360bbb-3(b)(1), unless the authorization is terminated or revoked sooner.     Influenza A by PCR NEGATIVE NEGATIVE Final   Influenza B by PCR NEGATIVE NEGATIVE Final    Comment: (NOTE) The Xpert Xpress SARS-CoV-2/FLU/RSV assay is intended as an aid in  the diagnosis of influenza from Nasopharyngeal swab specimens and  should not be used as a sole basis for treatment. Nasal washings and  aspirates are  unacceptable for Xpert Xpress SARS-CoV-2/FLU/RSV  testing.  Fact Sheet for Patients: https://www.moore.com/  Fact Sheet for Healthcare Providers: https://www.young.biz/  This test is not yet approved or cleared by the [place] FDA and  has been authorized for detection and/or diagnosis of SARS-CoV-2 by  FDA under an Emergency Use Authorization (EUA). This EUA will remain  in effect (meaning this test can be used) for the duration of the  Covid-19 declaration under Section 564(b)(1) of the Act, 21  U.S.C. section 360bbb-3(b)(1), unless the authorization is  terminated or revoked. Performed at [facility], [address] [place], [place] [address]          Radiology Studies: DG Chest 2 View  Result Date: 07/17/2020 CLINICAL DATA:  Chest pain. EXAM: CHEST - 2 VIEW COMPARISON:  July 17, 2020. FINDINGS: The heart size and mediastinal contours are within normal limits. Both lungs are clear. No visible pleural effusions or pneumothorax. The visualized skeletal structures are unremarkable. IMPRESSION: No active cardiopulmonary disease. Electronically Signed   By: Feliberto Harts MD   On: 07/17/2020 15:59   CT Angio Chest PE W/Cm &/Or Wo Cm  Result Date: 07/17/2020 CLINICAL DATA:  Right-sided chest pain since Saturday. EXAM: CT ANGIOGRAPHY CHEST WITH CONTRAST TECHNIQUE: Multidetector CT imaging of the chest was performed using the standard protocol during bolus administration of intravenous contrast. Multiplanar CT image reconstructions and MIPs were obtained to evaluate the vascular anatomy. CONTRAST:  106mL OMNIPAQUE IOHEXOL 350 MG/ML SOLN COMPARISON:  Chest x-ray from same day. CTA chest dated October 20, 2011. FINDINGS: Cardiovascular: Satisfactory opacification of the pulmonary arteries to the segmental level. No evidence of pulmonary embolism. Normal heart size. No pericardial effusion. No thoracic aortic aneurysm or dissection.  Mediastinum/Nodes: New multiple enlarged bilateral axillary and subpectoral lymph nodes measuring up to 1.4 cm in short axis on the right and 1.6 cm on the left. No enlarged mediastinal or hilar lymph nodes. The thyroid gland, trachea, and esophagus demonstrate no significant findings. Lungs/Pleura: Trace right pleural effusion with minimal subsegmental atelectasis in the right lower lobe. No focal consolidation or pneumothorax. Upper Abdomen: No acute abnormality. Musculoskeletal: No chest wall abnormality. No acute or significant osseous findings. Review of the MIP images confirms the above findings. IMPRESSION: 1. New multiple enlarged bilateral  axillary and subpectoral lymph nodes measuring up to 1.6 cm, concerning for lymphoma. These are amenable to tissue sampling. 2. No evidence of pulmonary embolism. 3. Trace right pleural effusion. Electronically Signed   By: Obie Dredge M.D.   On: 07/17/2020 19:33        Scheduled Meds: . sodium chloride   Intravenous Once  . furosemide  40 mg Intravenous Once   Continuous Infusions:   LOS: 0 days    Time spent: Zannie Cove, MD [facility]  07/18/2020, 1:46 PM

## 2020-07-18 NOTE — Progress Notes (Signed)
   Request for LAN biopsy per [facility] Dr Adela Glimpse  Known Fibromyalgia; anemia; remote Lyme disease and arthritis  To ED last pm with SOB; Right chest pain with deep breaths Imaging reveals: IMPRESSION: 1. New multiple enlarged bilateral axillary and subpectoral lymph nodes measuring up to 1.6 cm, concerning for lymphoma. These are amenable to tissue sampling. 2. No evidence of pulmonary embolism. 3. Trace right pleural effusion.   Request made for bx  I have reviewed chart and imaging with Dr Fredia Sorrow  Lymphadenopathy bx could be considered for this pt But Recommend: Full lab work up to include viral tests Consider CT Neck And  CT Abd /Pelvis for completeness  We will keep pt on IR Radar When work up is complete--- We can re review imaging and develop plan for tissue biopsy at that time Per Dr Fredia Sorrow   [facility] aware

## 2020-07-18 NOTE — Consult Note (Addendum)
[facility]  Telephone:([phone] [phone]   MEDICAL ONCOLOGY - INITIAL CONSULTATION  Referral MD: Dr. Zannie Cove  Reason for Referral: lymphadenopathy  HPI: Katie Middleton is a 34 year old female with a past medical history significant for Lyme disease, polyarthritis, anemia (has received IV iron and blood transfusions in the past.  The patient presented to the emergency room with right-sided chest pain which was worse with deep breath.  On admission, her WBC was normal 8.3, hemoglobin 6.7, MCV 68.5, platelets 302,000.  LDH was normal at 163.  Vitamin B12 and folate levels were normal.  Ferritin was low at 5, iron low at 12, and percent saturation was low at 4%.  She has received 1 unit PRBC so far this admission.  CTA chest performed on admission showed new multiple enlarged bilateral axillary and subpectoral lymph nodes measuring up to 1.6 cm concerning for lymphoma, no PE.  A CT of the neck and a CT of the abdomen/pelvis have been ordered and performed but not yet read.  The patient was seen in the emergency room.  She recently received pain medication and reports her pain has resolved.  She states that she has noticed some enlarged areas under her axilla, but did not know what they were.  She is not sure how long these have been present and if they are getting bigger or waxing and waning.  She has not had any recent fevers or chills.  Denies headaches and dizziness.  She reports a poor appetite but is not sure if she has lost weight.  She reports having night sweats on one occasion this past Saturday.  Denies shortness of breath, abdominal pain, nausea, vomiting.  Reports that she has constipation but no diarrhea.  She has not noticed any bleeding such as epistaxis, hemoptysis, hematemesis, hematuria, melena, hematochezia.  Medical oncology was asked see the patient make recommendations regarding her lymphadenopathy.   Past Medical History:  Diagnosis Date  . Anemia    . Lyme disease   . Rheumatoid arthritis (HCC)   :  Past Surgical History:  Procedure Laterality Date  . CESAREAN SECTION    . TUBAL LIGATION    :  Current Facility-Administered Medications  Medication Dose Route Frequency Provider Last Rate Last Admin  . 0.9 %  sodium chloride infusion (Manually program via Guardrails IV Fluids)   Intravenous Once Therisa Doyne, MD      . acetaminophen (TYLENOL) tablet 650 mg  650 mg Oral Q6H PRN Doutova, Anastassia, MD   650 mg at 07/18/20 1254   Or  . acetaminophen (TYLENOL) suppository 650 mg  650 mg Rectal Q6H PRN Doutova, Anastassia, MD      . HYDROcodone-acetaminophen (NORCO/VICODIN) 5-325 MG per tablet 1-2 tablet  1-2 tablet Oral Q4H PRN Therisa Doyne, MD   2 tablet at 07/18/20 1254   Current Outpatient Medications  Medication Sig Dispense Refill  . diclofenac (VOLTAREN) 75 MG EC tablet Take 37.5 mg by mouth 2 (two) times daily as needed for mild pain.     . predniSONE (STERAPRED UNI-PAK 21 TAB) 10 MG (21) TBPK tablet Take per package instructions (Patient not taking: Reported on 07/18/2020) 1 each 0     Allergies  Allergen Reactions  . Bactrim [Sulfamethoxazole-Trimethoprim] Other (See Comments)    "BLE hurts really, really bad"  . Penicillins Anaphylaxis    Did it involve swelling of the face/tongue/throat, SOB, or low BP? Unknown Did it involve sudden or severe rash/hives, skin peeling, or  any reaction on the inside of your mouth or nose? Unknown Did you need to seek medical attention at a hospital or doctor's office? Unknown When did it last happen?at infancy If all above answers are "NO", may proceed with cephalosporin use.    Marland Kitchen Amoxicillin   . Bactroban [Mupirocin Calcium] Hives  :  Family History  Problem Relation Age of Onset  . Heart attack Father   :  Social History   Socioeconomic History  . Marital status: Single    Spouse name: Not on file  . Number of children: Not on file  . Years of  education: Not on file  . Highest education level: Not on file  Occupational History  . Not on file  Tobacco Use  . Smoking status: Former Smoker    Types: Cigarettes    Quit date: 12/15/2018    Years since quitting: 1.5  . Smokeless tobacco: Never Used  Vaping Use  . Vaping Use: Never used  Substance and Sexual Activity  . Alcohol use: Yes    Comment: occ  . Drug use: No  . Sexual activity: Not Currently    Birth control/protection: Surgical  Other Topics Concern  . Not on file  Social History Narrative  . Not on file   Social Determinants of Health   Financial Resource Strain:   . Difficulty of Paying Living Expenses: Not on file  Food Insecurity:   . Worried About Programme researcher, broadcasting/film/video in the Last Year: Not on file  . Ran Out of Food in the Last Year: Not on file  Transportation Needs:   . Lack of Transportation (Medical): Not on file  . Lack of Transportation (Non-Medical): Not on file  Physical Activity:   . Days of Exercise per Week: Not on file  . Minutes of Exercise per Session: Not on file  Stress:   . Feeling of Stress : Not on file  Social Connections:   . Frequency of Communication with Friends and Family: Not on file  . Frequency of Social Gatherings with Friends and Family: Not on file  . Attends Religious Services: Not on file  . Active Member of Clubs or Organizations: Not on file  . Attends Banker Meetings: Not on file  . Marital Status: Not on file  Intimate Partner Violence:   . Fear of Current or Ex-Partner: Not on file  . Emotionally Abused: Not on file  . Physically Abused: Not on file  . Sexually Abused: Not on file  :  Review of Systems: A comprehensive 14 point review of systems was negative except as noted in the HPI.  Exam: Patient Vitals for the past 24 hrs:  BP Temp Temp src Pulse Resp SpO2 Height Weight  07/18/20 1400 (!) 102/57 -- -- 91 (!) 24 99 % -- --  07/18/20 1355 92/76 98.1 F (36.7 C) Oral (!) 101 (!) 40 99 %  -- --  07/18/20 1330 102/60 -- -- (!) 108 (!) 33 100 % -- --  07/18/20 1315 (!) 111/56 -- -- (!) 108 (!) 59 100 % -- --  07/18/20 1245 115/63 99.5 F (37.5 C) Axillary (!) 109 (!) 21 98 % -- --  07/18/20 1230 (!) 107/57 -- -- -- (!) 38 -- -- --  07/18/20 1215 99/60 -- -- -- (!) 37 -- -- --  07/18/20 1200 111/62 -- -- -- (!) 50 -- -- --  07/18/20 1145 113/62 -- -- -- (!) 39 -- -- --  07/18/20  1130 (!) 114/52 -- -- (!) 107 (!) 30 99 % -- --  07/18/20 1030 (!) 107/48 -- -- -- (!) 42 -- -- --  07/18/20 1015 (!) 102/53 -- -- -- (!) 21 -- -- --  07/18/20 1000 100/83 -- -- -- (!) 26 -- -- --  07/18/20 0945 (!) 110/58 -- -- -- (!) 37 -- -- --  07/18/20 0930 113/60 -- -- -- (!) 33 -- -- --  07/18/20 0915 (!) 117/54 -- -- -- (!) 36 -- -- --  07/18/20 0900 (!) 96/58 -- -- -- (!) 34 -- -- --  07/18/20 0845 110/61 -- -- -- (!) 28 -- -- --  07/18/20 0645 (!) 111/58 -- -- 99 -- 100 % -- --  07/18/20 0530 (!) 113/53 -- -- (!) 101 -- 99 % -- --  07/18/20 0459 114/60 -- -- (!) 101 18 100 % -- --  07/18/20 0334 -- 99.9 F (37.7 C) Oral -- -- -- -- --  07/18/20 0333 109/60 -- -- (!) 101 -- 100 % -- --  07/18/20 0011 (!) 111/58 99.4 F (37.4 C) Oral (!) 107 18 100 % -- --  07/17/20 2355 116/64 99.9 F (37.7 C) Oral (!) 104 18 100 % -- --  07/17/20 2200 (!) 108/58 -- -- 95 -- 100 % -- --  07/17/20 2115 112/70 -- -- (!) 102 -- 100 % -- --  07/17/20 2057 111/65 -- -- 96 -- 100 % -- --  07/17/20 2015 112/70 -- -- 95 20 95 % -- --  07/17/20 1830 (!) 104/56 -- -- 84 (!) 21 100 % -- --  07/17/20 1815 (!) 105/59 -- -- 91 (!) 26 100 % -- --  07/17/20 1800 113/61 -- -- 92 (!) 28 100 % -- --  07/17/20 1752 (!) 104/56 -- -- 88 (!) 26 100 % -- --  07/17/20 1525 -- -- -- -- -- -- 5\' 3"  (1.6 m) 63.5 kg  07/17/20 1524 109/66 (!) 101.4 F (38.6 C) Oral (!) 117 16 99 % -- --    General:  well-nourished in no acute distress.   Eyes:  no scleral icterus.   ENT:  There were no oropharyngeal lesions.   Neck was  without thyromegaly.   Lymphatics: Shotty axillary lymphadenopathy Respiratory: lungs were clear bilaterally without wheezing or crackles.   Cardiovascular:  Regular rate and rhythm, S1/S2, without murmur, rub or gallop.  There was no pedal edema.   GI:  abdomen was soft, flat, nontender, nondistended, without organomegaly.   Musculoskeletal:  no spinal tenderness of palpation of vertebral spine.   Skin exam was without echymosis, petichae.   Neuro exam was nonfocal. Patient was alert and oriented.  Attention was good.   Language was appropriate.  Mood was normal without depression.  Speech was not pressured.  Thought content was not tangential.     Lab Results  Component Value Date   WBC 8.5 07/18/2020   HGB 7.4 (L) 07/18/2020   HCT 26.5 (L) 07/18/2020   PLT 253 07/18/2020   GLUCOSE 94 07/18/2020   ALT 9 07/18/2020   AST 18 07/18/2020   NA 136 07/18/2020   K 3.4 (L) 07/18/2020   CL 108 07/18/2020   CREATININE 0.62 07/18/2020   BUN 5 (L) 07/18/2020   CO2 21 (L) 07/18/2020    DG Chest 2 View  Result Date: 07/17/2020 CLINICAL DATA:  Chest pain. EXAM: CHEST - 2 VIEW COMPARISON:  July 17, 2020. FINDINGS: The heart size and mediastinal  contours are within normal limits. Both lungs are clear. No visible pleural effusions or pneumothorax. The visualized skeletal structures are unremarkable. IMPRESSION: No active cardiopulmonary disease. Electronically Signed   By: Feliberto Harts MD   On: 07/17/2020 15:59   CT Angio Chest PE W/Cm &/Or Wo Cm  Result Date: 07/17/2020 CLINICAL DATA:  Right-sided chest pain since Saturday. EXAM: CT ANGIOGRAPHY CHEST WITH CONTRAST TECHNIQUE: Multidetector CT imaging of the chest was performed using the standard protocol during bolus administration of intravenous contrast. Multiplanar CT image reconstructions and MIPs were obtained to evaluate the vascular anatomy. CONTRAST:  55mL OMNIPAQUE IOHEXOL 350 MG/ML SOLN COMPARISON:  Chest x-ray from same day. CTA  chest dated October 20, 2011. FINDINGS: Cardiovascular: Satisfactory opacification of the pulmonary arteries to the segmental level. No evidence of pulmonary embolism. Normal heart size. No pericardial effusion. No thoracic aortic aneurysm or dissection. Mediastinum/Nodes: New multiple enlarged bilateral axillary and subpectoral lymph nodes measuring up to 1.4 cm in short axis on the right and 1.6 cm on the left. No enlarged mediastinal or hilar lymph nodes. The thyroid gland, trachea, and esophagus demonstrate no significant findings. Lungs/Pleura: Trace right pleural effusion with minimal subsegmental atelectasis in the right lower lobe. No focal consolidation or pneumothorax. Upper Abdomen: No acute abnormality. Musculoskeletal: No chest wall abnormality. No acute or significant osseous findings. Review of the MIP images confirms the above findings. IMPRESSION: 1. New multiple enlarged bilateral axillary and subpectoral lymph nodes measuring up to 1.6 cm, concerning for lymphoma. These are amenable to tissue sampling. 2. No evidence of pulmonary embolism. 3. Trace right pleural effusion. Electronically Signed   By: Obie Dredge M.D.   On: 07/17/2020 19:33   DG CHEST PORT 1 VIEW  Result Date: 07/18/2020 CLINICAL DATA:  Hypoxia. Dyspnea. Additional provided: Onset of shortness of breath and fever yesterday. EXAM: PORTABLE CHEST 1 VIEW COMPARISON:  CT angiogram chest 07/17/2020. FINDINGS: Shallow inspiration radiograph. Heart size within normal limits. Mild ill-defined opacity within the right greater than left lung bases. No sizable pleural effusion or evidence of pneumothorax. No acute bony abnormality identified. IMPRESSION: Shallow inspiration radiograph. Mild ill-defined opacity within the right greater than left lung bases, which may reflect atelectasis or pneumonia. Repeat PA and lateral chest radiographs with improved inspiratory effort may be helpful for further evaluation. Electronically Signed    By: Jackey Loge DO   On: 07/18/2020 14:17     DG Chest 2 View  Result Date: 07/17/2020 CLINICAL DATA:  Chest pain. EXAM: CHEST - 2 VIEW COMPARISON:  July 17, 2020. FINDINGS: The heart size and mediastinal contours are within normal limits. Both lungs are clear. No visible pleural effusions or pneumothorax. The visualized skeletal structures are unremarkable. IMPRESSION: No active cardiopulmonary disease. Electronically Signed   By: Feliberto Harts MD   On: 07/17/2020 15:59   CT Angio Chest PE W/Cm &/Or Wo Cm  Result Date: 07/17/2020 CLINICAL DATA:  Right-sided chest pain since Saturday. EXAM: CT ANGIOGRAPHY CHEST WITH CONTRAST TECHNIQUE: Multidetector CT imaging of the chest was performed using the standard protocol during bolus administration of intravenous contrast. Multiplanar CT image reconstructions and MIPs were obtained to evaluate the vascular anatomy. CONTRAST:  107mL OMNIPAQUE IOHEXOL 350 MG/ML SOLN COMPARISON:  Chest x-ray from same day. CTA chest dated October 20, 2011. FINDINGS: Cardiovascular: Satisfactory opacification of the pulmonary arteries to the segmental level. No evidence of pulmonary embolism. Normal heart size. No pericardial effusion. No thoracic aortic aneurysm or dissection. Mediastinum/Nodes: New multiple enlarged bilateral axillary  and subpectoral lymph nodes measuring up to 1.4 cm in short axis on the right and 1.6 cm on the left. No enlarged mediastinal or hilar lymph nodes. The thyroid gland, trachea, and esophagus demonstrate no significant findings. Lungs/Pleura: Trace right pleural effusion with minimal subsegmental atelectasis in the right lower lobe. No focal consolidation or pneumothorax. Upper Abdomen: No acute abnormality. Musculoskeletal: No chest wall abnormality. No acute or significant osseous findings. Review of the MIP images confirms the above findings. IMPRESSION: 1. New multiple enlarged bilateral axillary and subpectoral lymph nodes measuring up to  1.6 cm, concerning for lymphoma. These are amenable to tissue sampling. 2. No evidence of pulmonary embolism. 3. Trace right pleural effusion. Electronically Signed   By: Obie Dredge M.D.   On: 07/17/2020 19:33   DG CHEST PORT 1 VIEW  Result Date: 07/18/2020 CLINICAL DATA:  Hypoxia. Dyspnea. Additional provided: Onset of shortness of breath and fever yesterday. EXAM: PORTABLE CHEST 1 VIEW COMPARISON:  CT angiogram chest 07/17/2020. FINDINGS: Shallow inspiration radiograph. Heart size within normal limits. Mild ill-defined opacity within the right greater than left lung bases. No sizable pleural effusion or evidence of pneumothorax. No acute bony abnormality identified. IMPRESSION: Shallow inspiration radiograph. Mild ill-defined opacity within the right greater than left lung bases, which may reflect atelectasis or pneumonia. Repeat PA and lateral chest radiographs with improved inspiratory effort may be helpful for further evaluation. Electronically Signed   By: Jackey Loge DO   On: 07/18/2020 14:17   Assessment and Plan:  This is a 34 year old female with 1.  Bilateral axillary lymphadenopathy 2.  Microcytic anemia due to iron deficiency 3.  Lyme disease 4.  Polyarthritis secondary #3  -I reviewed the patient's CTA of the chest with her.  We discussed that additional testing including the CT of the neck and CT of the abdomen/pelvis are still pending.  We discussed that she has enlarged lymph nodes noted on the CT of the chest concerning for lymphoma.  Awaiting results of CT of the neck and CT of the abdomen/pelvis.  Recommend biopsy by IR of one of her axillary lymph nodes.  Once we have biopsy results, we can discuss with the patient and make treatment recommendations. -The patient has microcytic anemia secondary to iron deficiency.  She has received IV iron in the past as an outpatient and has been receiving blood transfusions intermittently as an outpatient.  She is status post 1 unit PRBCs.   The patient would benefit from IV iron in the form of Feraheme 510 mg IV x2 doses.  The first dose can be given as an inpatient and we can repeat the second dose as an outpatient.  Thank you for this referral.   Katie Pare, DNP, AGPCNP-BC, AOCNP  Attending Note  I personally went to see the patient, reviewed the chart and examined the patient. I agree with the assessment and plan as documented above. Thank you very much for the consultation. Generalized lymphadenopathy: Differential diagnosis between lymphoma versus reactive lymphadenopathy. Interventional radiology guided biopsy is being done today.  We will need to await those results.  Iron deficiency anemia: 1 unit of PRBC given and we also recommend IV iron.  Is suspected to be due to heavy menstrual losses.  Await the results of the biopsy and then additional recommendations based on that.

## 2020-07-18 NOTE — ED Notes (Signed)
Provider messaged at this time, "Hi! FYI, patient is tachypneic to the high 50's. POx is 100 % but she is nasal flaring and panting. temp is 99.5. Thanks. " Resp therapy also aware. Patient is alert, calm and on all monitors at this time.

## 2020-07-18 NOTE — ED Notes (Signed)
Lunch Tray Ordered @ 1009. 

## 2020-07-19 ENCOUNTER — Other Ambulatory Visit: Payer: Self-pay

## 2020-07-19 ENCOUNTER — Encounter (HOSPITAL_COMMUNITY): Payer: Self-pay | Admitting: Internal Medicine

## 2020-07-19 ENCOUNTER — Inpatient Hospital Stay (HOSPITAL_COMMUNITY): Payer: Medicaid Other

## 2020-07-19 DIAGNOSIS — R591 Generalized enlarged lymph nodes: Secondary | ICD-10-CM

## 2020-07-19 DIAGNOSIS — D509 Iron deficiency anemia, unspecified: Principal | ICD-10-CM

## 2020-07-19 LAB — EPSTEIN-BARR VIRUS (EBV) ANTIBODY PROFILE
EBV NA IgG: >600 U/mL — ABNORMAL HIGH (ref 0.0–17.9)
EBV VCA IgG: >600 U/mL — ABNORMAL HIGH (ref 0.0–17.9)
EBV VCA IgM: <36 U/mL (ref 0.0–35.9)

## 2020-07-19 LAB — COMPREHENSIVE METABOLIC PANEL
ALT: 8 U/L (ref 0–44)
AST: 16 U/L (ref 15–41)
Albumin: 2.7 g/dL — ABNORMAL LOW (ref 3.5–5.0)
Alkaline Phosphatase: 46 U/L (ref 38–126)
Anion gap: 9 (ref 5–15)
BUN: 7 mg/dL (ref 6–20)
CO2: 22 mmol/L (ref 22–32)
Calcium: 8.4 mg/dL — ABNORMAL LOW (ref 8.9–10.3)
Chloride: 104 mmol/L (ref 98–111)
Creatinine, Ser: 0.54 mg/dL (ref 0.44–1.00)
GFR, Estimated: >60 mL/min (ref >60)
Glucose, Bld: 96 mg/dL (ref 70–99)
Potassium: 3.3 mmol/L — ABNORMAL LOW (ref 3.5–5.1)
Sodium: 135 mmol/L (ref 135–145)
Total Bilirubin: 0.3 mg/dL (ref 0.3–1.2)
Total Protein: 7 g/dL (ref 6.5–8.1)

## 2020-07-19 LAB — CBC
HCT: 25 % — ABNORMAL LOW (ref 36.0–46.0)
Hemoglobin: 7.1 g/dL — ABNORMAL LOW (ref 12.0–15.0)
MCH: 20.2 pg — ABNORMAL LOW (ref 26.0–34.0)
MCHC: 28.4 g/dL — ABNORMAL LOW (ref 30.0–36.0)
MCV: 71 fL — ABNORMAL LOW (ref 80.0–100.0)
Platelets: 249 10*3/uL (ref 150–400)
RBC: 3.52 MIL/uL — ABNORMAL LOW (ref 3.87–5.11)
RDW: 18.5 % — ABNORMAL HIGH (ref 11.5–15.5)
WBC: 9.5 10*3/uL (ref 4.0–10.5)
nRBC: 0 % (ref 0.0–0.2)

## 2020-07-19 LAB — HIV-1 RNA QUANT-NO REFLEX-BLD
HIV 1 RNA Quant: <20 copies/mL
LOG10 HIV-1 RNA: UNDETERMINED log10copy/mL

## 2020-07-19 LAB — ENA+DNA/DS+ANTICH+CENTRO+JO...
Anti JO-1: <0.2 AI (ref 0.0–0.9)
Centromere Ab Screen: <0.2 AI (ref 0.0–0.9)
Chromatin Ab SerPl-aCnc: >8 AI — ABNORMAL HIGH (ref 0.0–0.9)
ENA SM Ab Ser-aCnc: >8 AI — ABNORMAL HIGH (ref 0.0–0.9)
Ribonucleic Protein: 5.2 AI — ABNORMAL HIGH (ref 0.0–0.9)
SSA (Ro) (ENA) Antibody, IgG: >8 AI — ABNORMAL HIGH (ref 0.0–0.9)
SSB (La) (ENA) Antibody, IgG: 0.4 AI (ref 0.0–0.9)
Scleroderma (Scl-70) (ENA) Antibody, IgG: <0.2 AI (ref 0.0–0.9)
ds DNA Ab: 55 IU/mL — ABNORMAL HIGH (ref 0–9)

## 2020-07-19 LAB — CMV IGM: CMV IgM: 40.3 AU/mL — ABNORMAL HIGH (ref 0.0–29.9)

## 2020-07-19 LAB — RHEUMATOID FACTOR: Rheumatoid fact SerPl-aCnc: <10 IU/mL (ref 0.0–13.9)

## 2020-07-19 LAB — CMV ANTIBODY, IGG (EIA): CMV Ab - IgG: >10 U/mL — ABNORMAL HIGH (ref 0.00–0.59)

## 2020-07-19 LAB — ANA W/REFLEX IF POSITIVE: Anti Nuclear Antibody (ANA): POSITIVE — AB

## 2020-07-19 MED ORDER — HYDROMORPHONE HCL 1 MG/ML IJ SOLN: 0.5000 mg | INTRAMUSCULAR | Status: AC | PRN

## 2020-07-19 MED ORDER — SODIUM CHLORIDE 0.9 % IV SOLN
510.0000 mg | Freq: Once | INTRAVENOUS | Status: AC
  Administered 2020-07-19: 510 mg via INTRAVENOUS
  Filled 2020-07-19: qty 17

## 2020-07-19 MED ORDER — MIDAZOLAM HCL 2 MG/2ML IJ SOLN
INTRAMUSCULAR | Status: AC
  Filled 2020-07-19: qty 2

## 2020-07-19 MED ORDER — FENTANYL CITRATE (PF) 100 MCG/2ML IJ SOLN
INTRAMUSCULAR | Status: AC
  Filled 2020-07-19: qty 2

## 2020-07-19 MED ORDER — LIDOCAINE HCL (PF) 1 % IJ SOLN
INTRAMUSCULAR | Status: AC
  Filled 2020-07-19: qty 30

## 2020-07-19 MED ORDER — FENTANYL CITRATE (PF) 100 MCG/2ML IJ SOLN
INTRAMUSCULAR | Status: AC | PRN
Start: 2020-07-19 — End: 2020-07-19
  Administered 2020-07-19: 25 ug via INTRAVENOUS

## 2020-07-19 MED ORDER — MIDAZOLAM HCL 2 MG/2ML IJ SOLN
INTRAMUSCULAR | Status: AC | PRN
  Administered 2020-07-19: 1 mg via INTRAVENOUS

## 2020-07-19 MED ORDER — DICLOFENAC SODIUM 1 % EX GEL
2.0000 g | Freq: Four times a day (QID) | CUTANEOUS | Status: DC
  Administered 2020-07-19 – 2020-07-21 (×6): 2 g via TOPICAL
  Filled 2020-07-19: qty 100

## 2020-07-19 NOTE — Procedures (Signed)
Interventional Radiology Procedure Note  Procedure: US guided biopsy of left axillary lymph node Complications: None EBL: None Recommendations: - Bedrest at least 1 hours.   - Routine wound care - Follow up pathology - Advance diet per primary team  Signed,  Gilmer Mor, DO

## 2020-07-19 NOTE — Progress Notes (Signed)
PROGRESS NOTE    Shahd Venzor  IPJ:[id number] DOB: July 19, 1986 DOA: 07/17/2020 PCP: Patient, No Pcp Per  Brief Narrative: 34 year old female with history of polyarthritis of unclear etiology, chronic iron deficiency anemia from menorrhagia requiring intermittent blood transfusions presented to the ED overnight with right-sided chest pain, shortness of breath. -Patient has been having polyarthritis involving small joints of her hands, wrists, ankles off and on for years she has been on numerous brief courses of prednisone often prescribed by emergency room physicians.  She noticed lymph nodes in her axilla for a few months, this past week she has had a sore throat as well, previously had Lyme's disease treated with doxycycline in 2018. 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 -In the interim she was having more right-sided chest pain with dyspnea which prompted her to come to the emergency room -Work-up in the ED noted hemoglobin of 6.3 microcytic with severe iron deficiency and CT chest with new multiple enlarged bilateral axillary and subpectoral lymph nodes measuring up to 1.6 cm, raising concern for possible lymphoma, no PE.   Assessment & Plan:   Acute on chronic iron deficiency anemia -Transfusing 1 unit of PRBC (per chart review NOT 2 per note from Dr. 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  Consultants:  IR hematology    Subjective: Still c/o chest pain on the right side-- unable to take a deep breath Told me she was adopted and does not know her family hx   Objective: Vitals:   07/18/20 1500 07/18/20 1515 07/18/20 2238 07/19/20 0539  BP:  103/62 105/68 (!) 98/52  Pulse: 95 85 96 79  Resp: (!) 28 (!) 21 (!) 21 18  Temp:  98.1 F (36.7 C) 98.1 F (36.7 C) 97.9 F (36.6 C)  TempSrc:  Oral Oral Oral  SpO2: 100% 100% 100% 100%  Weight:      Height:        Intake/Output Summary (Last 24 hours) at 07/19/2020 1238 Last data filed at 07/19/2020 7341 Gross per 24 hour  Intake 350 ml  Output --  Net 350 ml   Filed Weights   07/17/20 1525  Weight: 63.5 kg    Examination:   General: Appearance:    Well developed, well nourished female in no acute distress  psych Poor eye contact/withdrawn  Lungs:     Clear to auscultation bilaterally, respirations unlabored- very poor effort  Heart:  Normal heart rate. Normal rhythm. No murmurs, rubs, or gallops.   MS:   All extremities are intact.   Neurologic:   Awake, alert, oriented x 3. No apparent focal neurological           defect.     Data Reviewed:   CBC: Recent Labs  Lab 07/17/20 1537 07/17/20 2207 07/18/20 0500 07/19/20 0254  WBC 8.3 6.9 8.5 9.5  NEUTROABS  --  5.7 7.3  --   HGB 6.7* 6.3* 7.4* 7.1*  HCT 24.4* 23.9* 26.5* 25.0*  MCV 68.5* 69.5* 71.0* 71.0*  PLT 302 262 253 249   Basic Metabolic Panel: Recent Labs  Lab 07/17/20 1537 07/17/20 2207 07/18/20 0500 07/19/20 0254  NA 135  --  136 135  K 3.5  --  3.4* 3.3*  CL 107   --  108 104  CO2 22  --  21* 22  GLUCOSE 100*  --  94 96  BUN 7  --  5* 7  CREATININE 0.61  --  0.62 0.54  CALCIUM 8.5*  --  8.0* 8.4*  MG  --  1.7 1.7  --   PHOS  --  3.2 3.3  --    GFR: Estimated Creatinine Clearance: 88.8 mL/min (by C-G formula based on SCr of 0.54 mg/dL). Liver Function Tests: Recent Labs  Lab 07/17/20 1755 07/17/20 2207 07/18/20 0500 07/19/20 0254  AST ALT ALKPHOS 61 49 49 46  BILITOT 0.6 0.6 1.1 0.3  PROT 8.3* 6.9 7.1 7.0  ALBUMIN 3.3* 2.7* 2.7* 2.7*   No results for input(s): LIPASE, AMYLASE in the last 168 hours. No results for input(s): AMMONIA in the last 168 hours. Coagulation Profile: Recent Labs  Lab 07/17/20 1755 07/17/20 1834  INR NOT CALCULATED 1.2   Cardiac Enzymes: Recent Labs  Lab 07/17/20 2207  CKTOTAL 13*   BNP (last 3 results) No results for input(s): PROBNP in the last 8760 hours. HbA1C: No results for input(s): HGBA1C in the last 72 hours. CBG: No results for input(s): GLUCAP in the last 168 hours. Lipid Profile: No results for input(s): CHOL, HDL, LDLCALC, TRIG, CHOLHDL, LDLDIRECT in the last 72 hours. Thyroid Function Tests: Recent Labs    07/18/20 0500  TSH 0.973   Anemia Panel: Recent Labs    07/17/20 2207  VITAMINB12 184  FOLATE 12.0  FERRITIN 5*  TIBC 291  IRON 12*  RETICCTPCT 1.0   Urine analysis:    Component Value Date/Time   COLORURINE YELLOW 07/18/2020 0135   APPEARANCEUR HAZY (A) 07/18/2020 0135   LABSPEC 1.045 (H) 07/18/2020 0135   PHURINE 6.0 07/18/2020 0135   GLUCOSEU NEGATIVE 07/18/2020 0135   HGBUR NEGATIVE 07/18/2020 0135   BILIRUBINUR NEGATIVE 07/18/2020 0135   KETONESUR 5 (A) 07/18/2020 0135   PROTEINUR NEGATIVE 07/18/2020 0135   UROBILINOGEN 1.0 04/05/2014 0841   NITRITE NEGATIVE 07/18/2020 0135   LEUKOCYTESUR NEGATIVE 07/18/2020 0135    Recent Results (from the past 240 hour(s))  Blood culture (routine single)     Status: None (Preliminary result)    Collection Time: 07/17/20  5:36 PM   Specimen: BLOOD  Result Value Ref Range Status   Specimen Description BLOOD SITE NOT SPECIFIED  Final   Special Requests   Final    BOTTLES DRAWN AEROBIC AND ANAEROBIC Blood Culture adequate volume   Culture   Final    NO GROWTH 2 DAYS Performed at [facility], [address], [place], [place] [address]    Report Status PENDING  Incomplete  Respiratory Panel by RT PCR (Flu A&B, Covid) - Nasopharyngeal Swab     Status: None   Collection Time: 07/17/20  6:00 PM   Specimen: Nasopharyngeal Swab  Result Value Ref Range Status   SARS Coronavirus 2 by RT PCR NEGATIVE NEGATIVE Final    Comment: (NOTE) SARS-CoV-2 target nucleic acids are NOT DETECTED.  The SARS-CoV-2 RNA is generally detectable in upper respiratoy specimens during the acute phase of infection. The lowest concentration of SARS-CoV-2 viral copies this assay can detect is 131 copies/mL. A negative result does not preclude SARS-Cov-2 infection and should not be used as the sole basis for treatment or other patient management decisions. A negative result may occur with  improper specimen collection/handling, submission of specimen other than nasopharyngeal swab, presence of viral mutation(s) within the areas targeted by this assay, and inadequate number of viral copies (<131 copies/mL). A negative result must be combined with clinical observations, patient history, and epidemiological information. The expected result is Negative.  Fact Sheet for Patients:  https://www.[place].com/  Fact Sheet for Healthcare Providers:  https://www.young.biz/  This test is no t yet approved or cleared by the [place] FDA and  has been authorized for detection and/or diagnosis of SARS-CoV-2 by FDA under an Emergency Use Authorization (EUA). This EUA will remain  in effect (meaning this test can be used) for the duration of the COVID-19 declaration under  Section 564(b)(1) of the Act, 21 U.S.C. section 360bbb-3(b)(1), unless the authorization is terminated or revoked sooner.     Influenza A by PCR NEGATIVE NEGATIVE Final   Influenza B by PCR NEGATIVE NEGATIVE Final    Comment: (NOTE) The Xpert Xpress SARS-CoV-2/FLU/RSV assay is intended as an aid in  the diagnosis of influenza from Nasopharyngeal swab specimens and  should not be used as a sole basis for treatment. Nasal washings and  aspirates are unacceptable for Xpert Xpress SARS-CoV-2/FLU/RSV  testing.  Fact Sheet for Patients: https://www.[place].com/  Fact Sheet for Healthcare Providers: https://www.young.biz/  This test is not yet approved or cleared by the [place] FDA and  has been authorized for detection and/or diagnosis of SARS-CoV-2 by  FDA under an Emergency Use Authorization (EUA). This EUA will remain  in effect (meaning this test can be used) for the duration of the  Covid-19 declaration under Section 564(b)(1) of the Act, 21  U.S.C. section 360bbb-3(b)(1), unless the authorization is  terminated or revoked. Performed at [facility], [address] [place], [place] [address]          Radiology Studies: DG Chest 2 View  Result Date: 07/17/2020 CLINICAL DATA:  Chest pain. EXAM: CHEST - 2 VIEW COMPARISON:  July 17, 2020. FINDINGS: The heart size and mediastinal contours are within normal limits. Both lungs are clear. No visible pleural effusions or pneumothorax. The visualized skeletal structures are unremarkable. IMPRESSION: No active cardiopulmonary disease. Electronically Signed   By: Feliberto Harts MD   On: 07/17/2020 15:59   CT SOFT TISSUE NECK W CONTRAST  Result Date: 07/18/2020 CLINICAL DATA:  Lymphadenopathy EXAM: CT NECK WITH CONTRAST TECHNIQUE: Multidetector CT imaging of the neck was performed using the standard protocol following the bolus administration of intravenous contrast. CONTRAST:   OMNIPAQUE IOHEXOL 300 MG/ML  SOLN COMPARISON:  None. FINDINGS: Pharynx and larynx: Unremarkable.  No mass or swelling. Salivary glands: Unremarkable. Thyroid: Normal. Lymph nodes: Nonenlarged and some top-normal cervical and supraclavicular lymph  nodes. Enlarged bilateral axillary lymph nodes as noted on prior chest CT. Vascular: Major neck vessels are patent. Limited intracranial: No abnormal enhancement. Visualized orbits: Unremarkable. Mastoids and visualized paranasal sinuses: Minor mucosal thickening. Skeleton: No significant abnormality. Upper chest: Included upper lungs are clear. Refer to recent chest CT. Other: None. IMPRESSION: Indeterminate nonenlarged and some top-normal cervical and supraclavicular lymph nodes. Electronically Signed   By: Guadlupe Spanish M.D.   On: 07/18/2020 15:13   CT Angio Chest PE W/Cm &/Or Wo Cm  Result Date: 07/17/2020 CLINICAL DATA:  Right-sided chest pain since Saturday. EXAM: CT ANGIOGRAPHY CHEST WITH CONTRAST TECHNIQUE: Multidetector CT imaging of the chest was performed using the standard protocol during bolus administration of intravenous contrast. Multiplanar CT image reconstructions and MIPs were obtained to evaluate the vascular anatomy. CONTRAST:  51mL OMNIPAQUE IOHEXOL 350 MG/ML SOLN COMPARISON:  Chest x-ray from same day. CTA chest dated October 20, 2011. FINDINGS: Cardiovascular: Satisfactory opacification of the pulmonary arteries to the segmental level. No evidence of pulmonary embolism. Normal heart size. No pericardial effusion. No thoracic aortic aneurysm or dissection. Mediastinum/Nodes: New multiple enlarged bilateral axillary and subpectoral lymph nodes measuring up to 1.4 cm in short axis on the right and 1.6 cm on the left. No enlarged mediastinal or hilar lymph nodes. The thyroid gland, trachea, and esophagus demonstrate no significant findings. Lungs/Pleura: Trace right pleural effusion with minimal subsegmental atelectasis in the right lower lobe. No  focal consolidation or pneumothorax. Upper Abdomen: No acute abnormality. Musculoskeletal: No chest wall abnormality. No acute or significant osseous findings. Review of the MIP images confirms the above findings. IMPRESSION: 1. New multiple enlarged bilateral axillary and subpectoral lymph nodes measuring up to 1.6 cm, concerning for lymphoma. These are amenable to tissue sampling. 2. No evidence of pulmonary embolism. 3. Trace right pleural effusion. Electronically Signed   By: Obie Dredge M.D.   On: 07/17/2020 19:33   CT ABDOMEN PELVIS W CONTRAST  Result Date: 07/18/2020 CLINICAL DATA:  Adenopathy in the chest EXAM: CT ABDOMEN AND PELVIS WITH CONTRAST TECHNIQUE: Multidetector CT imaging of the abdomen and pelvis was performed using the standard protocol following bolus administration of intravenous contrast. CONTRAST:  OMNIPAQUE IOHEXOL 300 MG/ML  SOLN COMPARISON:  None. FINDINGS: Lower chest: Bibasilar atelectasis. Hepatobiliary: No focal liver lesion. Increased density within the gallbladder may reflect sludge or vicarious excretion of contrast. Pancreas: Unremarkable. Spleen: Mild splenomegaly measuring 14 cm craniocaudal. Adrenals/Urinary Tract: There is residual contrast within the bladder. Adrenals and kidneys are unremarkable. Stomach/Bowel: Stomach is within normal limits. Bowel is normal in caliber. Vascular/Lymphatic: No significant vascular findings are present. Bilateral iliac and inguinal adenopathy. For example, left common iliac node measures 1 cm short axis on series 3, image 47. Left external iliac node measures 2.6 x 1.4 cm on image 70. Reproductive: Uterus is unremarkable. Right adnexal cystic lesion measuring 5.6 cm. Other: Small umbilical fat containing hernia. Musculoskeletal: No acute osseous abnormality. IMPRESSION: Pelvic retroperitoneal and inguinal adenopathy. Mild splenomegaly. Together with findings on prior imaging, lymphoma is suspected. Electronically Signed   By:  Guadlupe Spanish M.D.   On: 07/18/2020 15:23   DG CHEST PORT 1 VIEW  Result Date: 07/18/2020 CLINICAL DATA:  Hypoxia. Dyspnea. Additional provided: Onset of shortness of breath and fever yesterday. EXAM: PORTABLE CHEST 1 VIEW COMPARISON:  CT angiogram chest 07/17/2020. FINDINGS: Shallow inspiration radiograph. Heart size within normal limits. Mild ill-defined opacity within the right greater than left lung bases. No sizable pleural effusion or evidence of pneumothorax.  No acute bony abnormality identified. IMPRESSION: Shallow inspiration radiograph. Mild ill-defined opacity within the right greater than left lung bases, which may reflect atelectasis or pneumonia. Repeat PA and lateral chest radiographs with improved inspiratory effort may be helpful for further evaluation. Electronically Signed   By: Jackey Loge DO   On: 07/18/2020 14:17        Scheduled Meds: . sodium chloride   Intravenous Once  . diclofenac Sodium  2 g Topical QID   Continuous Infusions:   LOS: 1 day    Time spent: Marlin Canary DO [facility]  07/19/2020, 12:38 PM

## 2020-07-19 NOTE — Plan of Care (Signed)
  Problem: Education: Goal: Knowledge of General Education information will improve Description: Including pain rating scale, medication(s)/side effects and non-pharmacologic comfort measures Outcome: Completed/Met

## 2020-07-19 NOTE — Progress Notes (Addendum)
Patient ID: Katie Middleton, female   DOB: 08/24/1986, 34 y.o.   MRN: [id number]      Chief Complaint: Patient was seen in consultation today for US guided left axillary lymph node biopsy Chief Complaint  Patient presents with  . Chest Pain    Referring Physician(s): Curcio,K,NP  Supervising Physician: Malachy Moan  Patient Status: [facility] - In-pt  History of Present Illness: Katie Middleton is a 34 y.o. female with past medical history of anemia, Lyme disease and rheumatoid arthritis who recently presented to [facility] ED with right-sided chest pain worse with deep breath.  CT angio chest revealed multiple enlarged bilateral axillary and subpectoral lymph nodes concerning for lymphoma, no PE, trace right pleural effusion.  Patient also noted to have some pelvic retroperitoneal and inguinal adenopathy on additional imaging along with mild splenomegaly.  Request now received from oncology for image guided left axillary lymph node biopsy for further evaluation.  Troponin was normal.  COVID -19 neg. WBC normal, hemoglobin 7.1, plts 249k, PT/INR nl.   Past Medical History:  Diagnosis Date  . Anemia   . Lyme disease   . Rheumatoid arthritis [facility])     Past Surgical History:  Procedure Laterality Date  . CESAREAN SECTION    . TUBAL LIGATION      Allergies: Bactrim [sulfamethoxazole-trimethoprim], Penicillins, Amoxicillin, and Bactroban [mupirocin calcium]  Medications: Prior to Admission medications   Medication Sig Start Date End Date Taking? Authorizing Provider  diclofenac (VOLTAREN) 75 MG EC tablet Take 37.5 mg by mouth 2 (two) times daily as needed for mild pain.  02/12/19  Yes [provider]  predniSONE (STERAPRED UNI-PAK 21 TAB) 10 MG (21) TBPK tablet Take per package instructions Patient not taking: Reported on 07/18/2020 04/24/20   Margarita Grizzle, MD     Family History  Adopted: Yes  Problem Relation Age of Onset  . Heart attack Father        patient told me she was  adopted and does not know her family history    Social History   Socioeconomic History  . Marital status: Single    Spouse name: Not on file  . Number of children: Not on file  . Years of education: Not on file  . Highest education level: Not on file  Occupational History  . Not on file  Tobacco Use  . Smoking status: Former Smoker    Types: Cigarettes    Quit date: 12/15/2018    Years since quitting: 1.5  . Smokeless tobacco: Never Used  Vaping Use  . Vaping Use: Never used  Substance and Sexual Activity  . Alcohol use: Yes    Comment: occ  . Drug use: No  . Sexual activity: Not Currently    Birth control/protection: Surgical  Other Topics Concern  . Not on file  Social History Narrative  . Not on file   Social Determinants of Health   Financial Resource Strain:   . Difficulty of Paying Living Expenses: Not on file  Food Insecurity:   . Worried About Programme researcher, broadcasting/film/video in the Last Year: Not on file  . Ran Out of Food in the Last Year: Not on file  Transportation Needs:   . Lack of Transportation (Medical): Not on file  . Lack of Transportation (Non-Medical): Not on file  Physical Activity:   . Days of Exercise per Week: Not on file  . Minutes of Exercise per Session: Not on file  Stress:   . Feeling of Stress :  Not on file  Social Connections:   . Frequency of Communication with Friends and Family: Not on file  . Frequency of Social Gatherings with Friends and Family: Not on file  . Attends Religious Services: Not on file  . Active Member of Clubs or Organizations: Not on file  . Attends Banker Meetings: Not on file  . Marital Status: Not on file      Review of Systems see above; currently denies fever, dyspnea, cough, abd/back pain,N/V or bleeding  Vital Signs: BP (!) 98/52 (BP Location: Right Arm)   Pulse 79   Temp 97.9 F (36.6 C) (Oral)   Resp 18   Ht 5\' 3"  (1.6 m)   Wt 140 lb (63.5 kg)   LMP 06/23/2020 (Approximate) Comment:  Tubal Ligation.  TRK  SpO2 100%   BMI 24.80 kg/m   Physical Exam awake, alert.  Chest with slightly diminished breath sounds right base, left clear.  Heart with regular rate and rhythm.  Abdomen soft, positive bowel sounds, nontender.  No lower extremity edema.   Imaging: DG Chest 2 View  Result Date: 07/17/2020 CLINICAL DATA:  Chest pain. EXAM: CHEST - 2 VIEW COMPARISON:  July 17, 2020. FINDINGS: The heart size and mediastinal contours are within normal limits. Both lungs are clear. No visible pleural effusions or pneumothorax. The visualized skeletal structures are unremarkable. IMPRESSION: No active cardiopulmonary disease. Electronically Signed   By: Feliberto Harts MD   On: 07/17/2020 15:59   CT SOFT TISSUE NECK W CONTRAST  Result Date: 07/18/2020 CLINICAL DATA:  Lymphadenopathy EXAM: CT NECK WITH CONTRAST TECHNIQUE: Multidetector CT imaging of the neck was performed using the standard protocol following the bolus administration of intravenous contrast. CONTRAST:  OMNIPAQUE IOHEXOL 300 MG/ML  SOLN COMPARISON:  None. FINDINGS: Pharynx and larynx: Unremarkable.  No mass or swelling. Salivary glands: Unremarkable. Thyroid: Normal. Lymph nodes: Nonenlarged and some top-normal cervical and supraclavicular lymph nodes. Enlarged bilateral axillary lymph nodes as noted on prior chest CT. Vascular: Major neck vessels are patent. Limited intracranial: No abnormal enhancement. Visualized orbits: Unremarkable. Mastoids and visualized paranasal sinuses: Minor mucosal thickening. Skeleton: No significant abnormality. Upper chest: Included upper lungs are clear. Refer to recent chest CT. Other: None. IMPRESSION: Indeterminate nonenlarged and some top-normal cervical and supraclavicular lymph nodes. Electronically Signed   By: Guadlupe Spanish M.D.   On: 07/18/2020 15:13   CT Angio Chest PE W/Cm &/Or Wo Cm  Result Date: 07/17/2020 CLINICAL DATA:  Right-sided chest pain since Saturday. EXAM: CT  ANGIOGRAPHY CHEST WITH CONTRAST TECHNIQUE: Multidetector CT imaging of the chest was performed using the standard protocol during bolus administration of intravenous contrast. Multiplanar CT image reconstructions and MIPs were obtained to evaluate the vascular anatomy. CONTRAST:  6mL OMNIPAQUE IOHEXOL 350 MG/ML SOLN COMPARISON:  Chest x-ray from same day. CTA chest dated October 20, 2011. FINDINGS: Cardiovascular: Satisfactory opacification of the pulmonary arteries to the segmental level. No evidence of pulmonary embolism. Normal heart size. No pericardial effusion. No thoracic aortic aneurysm or dissection. Mediastinum/Nodes: New multiple enlarged bilateral axillary and subpectoral lymph nodes measuring up to 1.4 cm in short axis on the right and 1.6 cm on the left. No enlarged mediastinal or hilar lymph nodes. The thyroid gland, trachea, and esophagus demonstrate no significant findings. Lungs/Pleura: Trace right pleural effusion with minimal subsegmental atelectasis in the right lower lobe. No focal consolidation or pneumothorax. Upper Abdomen: No acute abnormality. Musculoskeletal: No chest wall abnormality. No acute or significant osseous findings. Review  of the MIP images confirms the above findings. IMPRESSION: 1. New multiple enlarged bilateral axillary and subpectoral lymph nodes measuring up to 1.6 cm, concerning for lymphoma. These are amenable to tissue sampling. 2. No evidence of pulmonary embolism. 3. Trace right pleural effusion. Electronically Signed   By: Obie Dredge M.D.   On: 07/17/2020 19:33   CT ABDOMEN PELVIS W CONTRAST  Result Date: 07/18/2020 CLINICAL DATA:  Adenopathy in the chest EXAM: CT ABDOMEN AND PELVIS WITH CONTRAST TECHNIQUE: Multidetector CT imaging of the abdomen and pelvis was performed using the standard protocol following bolus administration of intravenous contrast. CONTRAST:  OMNIPAQUE IOHEXOL 300 MG/ML  SOLN COMPARISON:  None. FINDINGS: Lower chest: Bibasilar  atelectasis. Hepatobiliary: No focal liver lesion. Increased density within the gallbladder may reflect sludge or vicarious excretion of contrast. Pancreas: Unremarkable. Spleen: Mild splenomegaly measuring 14 cm craniocaudal. Adrenals/Urinary Tract: There is residual contrast within the bladder. Adrenals and kidneys are unremarkable. Stomach/Bowel: Stomach is within normal limits. Bowel is normal in caliber. Vascular/Lymphatic: No significant vascular findings are present. Bilateral iliac and inguinal adenopathy. For example, left common iliac node measures 1 cm short axis on series 3, image 47. Left external iliac node measures 2.6 x 1.4 cm on image 70. Reproductive: Uterus is unremarkable. Right adnexal cystic lesion measuring 5.6 cm. Other: Small umbilical fat containing hernia. Musculoskeletal: No acute osseous abnormality. IMPRESSION: Pelvic retroperitoneal and inguinal adenopathy. Mild splenomegaly. Together with findings on prior imaging, lymphoma is suspected. Electronically Signed   By: Guadlupe Spanish M.D.   On: 07/18/2020 15:23   DG CHEST PORT 1 VIEW  Result Date: 07/18/2020 CLINICAL DATA:  Hypoxia. Dyspnea. Additional provided: Onset of shortness of breath and fever yesterday. EXAM: PORTABLE CHEST 1 VIEW COMPARISON:  CT angiogram chest 07/17/2020. FINDINGS: Shallow inspiration radiograph. Heart size within normal limits. Mild ill-defined opacity within the right greater than left lung bases. No sizable pleural effusion or evidence of pneumothorax. No acute bony abnormality identified. IMPRESSION: Shallow inspiration radiograph. Mild ill-defined opacity within the right greater than left lung bases, which may reflect atelectasis or pneumonia. Repeat PA and lateral chest radiographs with improved inspiratory effort may be helpful for further evaluation. Electronically Signed   By: Jackey Loge DO   On: 07/18/2020 14:17    Labs:  CBC: Recent Labs    07/17/20 1537 07/17/20 2207 07/18/20 0500  07/19/20 0254  WBC 8.3 6.9 8.5 9.5  HGB 6.7* 6.3* 7.4* 7.1*  HCT 24.4* 23.9* 26.5* 25.0*  PLT 302 262 253 249    COAGS: Recent Labs    07/17/20 1755 07/17/20 1834  INR NOT CALCULATED 1.2  APTT QUESTIONABLE RESULTS, RECOMMEND RECOLLECT TO VERIFY 32    BMP: Recent Labs    04/24/20 1010 07/17/20 1537 07/18/20 0500 07/19/20 0254  NA 137 135 136 135  K 4.3 3.5 3.4* 3.3*  CL 106 107 108 104  CO2 26 22 21* 22  GLUCOSE 92 100* 94 96  BUN 13 7 5* 7  CALCIUM 8.3* 8.5* 8.0* 8.4*  CREATININE 0.66 0.61 0.62 0.54  GFRNONAA >60 >60 >60 >60  GFRAA >60  --   --   --     LIVER FUNCTION TESTS: Recent Labs    07/17/20 1755 07/17/20 2207 07/18/20 0500 07/19/20 0254  BILITOT 0.6 0.6 1.1 0.3  AST 24 16 18 16   ALT 9 9 9 8   ALKPHOS 61 49 49 46  PROT 8.3* 6.9 7.1 7.0  ALBUMIN 3.3* 2.7* 2.7* 2.7*  TUMOR MARKERS: No results for input(s): AFPTM, CEA, CA199, CHROMGRNA in the last 8760 hours.  Assessment and Plan: 34 y.o. female with past medical history of anemia, Lyme disease and rheumatoid arthritis who recently presented to [facility] ED with right-sided chest pain worse with deep breath.  CT angio chest revealed multiple enlarged bilateral axillary and subpectoral lymph nodes concerning for lymphoma, no PE, trace right pleural effusion.  Patient also noted to have some pelvic retroperitoneal and inguinal adenopathy on additional imaging along with mild splenomegaly.  Request now received from oncology for image guided left axillary lymph node biopsy for further evaluation.  Troponin was normal.  COVID -19 neg. WBC normal, hemoglobin 7.1, plts 249k, PT/INR nl.  Imaging studies were reviewed by Dr. Archer Asa.  Risks and benefits of image guided left axillary lymph node biopsy were discussed with the patient  including, but not limited to bleeding, infection, damage to adjacent structures or low yield requiring additional tests.  All of the questions were answered and there is  agreement to proceed.  Consent signed and in chart.  Procedure tent scheduled for this afternoon.   Thank you for this interesting consult.  I greatly enjoyed meeting Katie Middleton and look forward to participating in their care.  A copy of this report was sent to the requesting provider on this date.  Electronically Signed: D. Jeananne Rama, PA-C 07/19/2020, 9:14 AM   I spent a total of  25 minutes   in face to face in clinical consultation, greater than 50% of which was counseling/coordinating care for image guided left axillary lymph node biopsy

## 2020-07-20 LAB — BASIC METABOLIC PANEL
Anion gap: 8 (ref 5–15)
BUN: 6 mg/dL (ref 6–20)
CO2: 23 mmol/L (ref 22–32)
Calcium: 8.6 mg/dL — ABNORMAL LOW (ref 8.9–10.3)
Chloride: 103 mmol/L (ref 98–111)
Creatinine, Ser: 0.71 mg/dL (ref 0.44–1.00)
GFR, Estimated: >60 mL/min (ref >60)
Glucose, Bld: 126 mg/dL — ABNORMAL HIGH (ref 70–99)
Potassium: 2.6 mmol/L — CL (ref 3.5–5.1)
Sodium: 134 mmol/L — ABNORMAL LOW (ref 135–145)

## 2020-07-20 LAB — CBC
HCT: 27.9 % — ABNORMAL LOW (ref 36.0–46.0)
Hemoglobin: 8.1 g/dL — ABNORMAL LOW (ref 12.0–15.0)
MCH: 20.2 pg — ABNORMAL LOW (ref 26.0–34.0)
MCHC: 29 g/dL — ABNORMAL LOW (ref 30.0–36.0)
MCV: 69.6 fL — ABNORMAL LOW (ref 80.0–100.0)
Platelets: 314 10*3/uL (ref 150–400)
RBC: 4.01 MIL/uL (ref 3.87–5.11)
RDW: 18.7 % — ABNORMAL HIGH (ref 11.5–15.5)
WBC: 7.8 10*3/uL (ref 4.0–10.5)
nRBC: 0 % (ref 0.0–0.2)

## 2020-07-20 LAB — FLUORESCENT TREPONEMAL AB(FTA)-IGG-BLD

## 2020-07-20 LAB — MAGNESIUM: Magnesium: 1.7 mg/dL (ref 1.7–2.4)

## 2020-07-20 MED ORDER — MAGNESIUM SULFATE 2 GM/50ML IV SOLN
2.0000 g | Freq: Once | INTRAVENOUS | Status: AC
  Administered 2020-07-20: 2 g via INTRAVENOUS
  Filled 2020-07-20: qty 50

## 2020-07-20 MED ORDER — METHOCARBAMOL 500 MG PO TABS
500.0000 mg | ORAL_TABLET | Freq: Three times a day (TID) | ORAL | Status: DC
  Administered 2020-07-20 (×2): 500 mg via ORAL
  Filled 2020-07-20 (×3): qty 1

## 2020-07-20 MED ORDER — POTASSIUM CHLORIDE CRYS ER 20 MEQ PO TBCR
40.0000 meq | EXTENDED_RELEASE_TABLET | ORAL | Status: AC
  Administered 2020-07-20 (×3): 40 meq via ORAL
  Filled 2020-07-20 (×4): qty 2

## 2020-07-20 NOTE — Progress Notes (Signed)
   07/20/20 0015  Assess: MEWS Score  Temp 99.1 F (37.3 C)  BP (!) 109/57  Pulse Rate (!) 106  Resp (!) 22  SpO2 98 %  O2 Device Room Air  Assess: MEWS Score  MEWS Temp 0  MEWS Systolic 0  MEWS Pulse 1  MEWS RR 1  MEWS LOC 0  MEWS Score 2  MEWS Score Color Yellow  Assess: if the MEWS score is Yellow or Red  Were vital signs taken at a resting state? Yes  Focused Assessment No change from prior assessment  Early Detection of Sepsis Score *See Row Information* Low  MEWS guidelines implemented *See Row Information* No, previously yellow, continue vital signs every 4 hours

## 2020-07-20 NOTE — Progress Notes (Signed)
PROGRESS NOTE    Katie Middleton  DZH:[id number] DOB: 13-Nov-1985 DOA: 07/17/2020 PCP: Patient, No Pcp Per  Brief Narrative: 34 year old female with history of polyarthritis of unclear etiology, chronic iron deficiency anemia from menorrhagia requiring intermittent blood transfusions presented to the ED overnight with right-sided chest pain, shortness of breath. -Patient has been having polyarthritis involving small joints of her hands, wrists, ankles off and on for years she has been on numerous brief courses of prednisone often prescribed by emergency room physicians.  She noticed lymph nodes in her axilla for a few months, this past week she has had a sore throat as well, previously had Lyme's disease treated with doxycycline in 2018. 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 -In the interim she was having more right-sided chest pain with dyspnea which prompted her to come to the emergency room -Work-up in the ED noted hemoglobin of 6.3 microcytic with severe iron deficiency and CT chest with new multiple enlarged bilateral axillary and subpectoral lymph nodes measuring up to 1.6 cm, raising concern for possible lymphoma, no PE.   Assessment & Plan:   Acute on chronic iron deficiency anemia -Transfusing 1 unit of PRBC (per chart review NOT 2 per note from Dr. 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 -check FTA-abs (RPR always positive)  Hypokalemia/hypomagnesemia -replete both   DVT prophylaxis: SCDs Code Status: Full code Family Communication: Discussed with patient in detail, no family at bedside Disposition Plan:  Status is: Inpatient  Remains inpatient appropriate because:Inpatient level of care appropriate due to severity of illness   Dispo: The patient is from: Home              Anticipated d/c is to: Home              Anticipated d/c date is: 1-2 days              Patient currently is not medically stable to d/c.  Consultants:  IR hematology    Subjective: Pain controlled with oxy but hurts to bend over and deep breathe  Objective: Vitals:   07/19/20 1801 07/20/20 0015 07/20/20 0512 07/20/20 1203  BP: 113/65 (!) 109/57 98/60 (!) 108/57  Pulse: (!) 101 (!) 106 94 (!) 101  Resp: (!) 27 (!) 22 20 20   Temp: 98.7 F (37.1 C) 99.1 F (37.3 C) 98.4 F (36.9 C) 99.1 F (37.3 C)  TempSrc: Oral Oral Oral Oral  SpO2: 100% 98% 99% 100%  Weight:      Height:        Intake/Output Summary (Last 24 hours) at 07/20/2020 1520 Last data filed at 07/19/2020 1800 Gross per 24 hour  Intake 240 ml  Output --  Net 240 ml   Filed Weights   07/17/20 1525  Weight: 63.5 kg    Examination:   General: Appearance:    Well developed, well nourished female in no acute distress   No rash on right flank  Lungs:     Clear  to auscultation bilaterally, poor effort and respirations unlabored  Heart:    Tachycardic. Normal rhythm. No murmurs, rubs, or gallops.   MS:   All extremities are intact.   Neurologic:   Awake, alert, oriented x 3. No apparent focal neurological           defect.      Data Reviewed:   CBC: Recent Labs  Lab 07/17/20 1537 07/17/20 2207 07/18/20 0500 07/19/20 0254 07/20/20 0211  WBC 8.3 6.9 8.5 9.5 7.8  NEUTROABS  --  5.7 7.3  --   --   HGB 6.7* 6.3* 7.4* 7.1* 8.1*  HCT 24.4* 23.9* 26.5* 25.0* 27.9*  MCV 68.5* 69.5* 71.0* 71.0* 69.6*   PLT 302 262 253 249 314   Basic Metabolic Panel: Recent Labs  Lab 07/17/20 1537 07/17/20 2207 07/18/20 0500 07/19/20 0254 07/20/20 0211  NA 135  --  136 135 134*  K 3.5  --  3.4* 3.3* 2.6*  CL 107  --  108 104 103  CO2 22  --  21* 22 23  GLUCOSE 100*  --  94 96 126*  BUN 7  --  5* 7 6  CREATININE 0.61  --  0.62 0.54 0.71  CALCIUM 8.5*  --  8.0* 8.4* 8.6*  MG  --  1.7 1.7  --  1.7  PHOS  --  3.2 3.3  --   --    GFR: Estimated Creatinine Clearance: 88.8 mL/min (by C-G formula based on SCr of 0.71 mg/dL). Liver Function Tests: Recent Labs  Lab 07/17/20 1755 07/17/20 2207 07/18/20 0500 07/19/20 0254  AST 24 16 18 16   ALT 9 9 9 8   ALKPHOS 61 49 49 46  BILITOT 0.6 0.6 1.1 0.3  PROT 8.3* 6.9 7.1 7.0  ALBUMIN 3.3* 2.7* 2.7* 2.7*   No results for input(s): LIPASE, AMYLASE in the last 168 hours. No results for input(s): AMMONIA in the last 168 hours. Coagulation Profile: Recent Labs  Lab 07/17/20 1755 07/17/20 1834  INR NOT CALCULATED 1.2   Cardiac Enzymes: Recent Labs  Lab 07/17/20 2207  CKTOTAL 13*   BNP (last 3 results) No results for input(s): PROBNP in the last 8760 hours. HbA1C: No results for input(s): HGBA1C in the last 72 hours. CBG: No results for input(s): GLUCAP in the last 168 hours. Lipid Profile: No results for input(s): CHOL, HDL, LDLCALC, TRIG, CHOLHDL, LDLDIRECT in the last 72 hours. Thyroid Function Tests: Recent Labs    07/18/20 0500  TSH 0.973   Anemia Panel: Recent Labs    07/17/20 2207  VITAMINB12 184  FOLATE 12.0  FERRITIN 5*  TIBC 291  IRON 12*  RETICCTPCT 1.0   Urine analysis:    Component Value Date/Time   COLORURINE YELLOW 07/18/2020 0135   APPEARANCEUR HAZY (A) 07/18/2020 0135   LABSPEC 1.045 (H) 07/18/2020 0135   PHURINE 6.0 07/18/2020 0135   GLUCOSEU NEGATIVE 07/18/2020 0135   HGBUR NEGATIVE 07/18/2020 0135   BILIRUBINUR NEGATIVE 07/18/2020 0135   KETONESUR 5 (A) 07/18/2020 0135   PROTEINUR NEGATIVE  07/18/2020 0135   UROBILINOGEN 1.0 04/05/2014 0841   NITRITE NEGATIVE 07/18/2020 0135   LEUKOCYTESUR NEGATIVE 07/18/2020 0135    Recent Results (from the past 240 hour(s))  Blood culture (routine single)     Status: None (Preliminary result)   Collection Time: 07/17/20  5:36 PM   Specimen: BLOOD  Result Value Ref Range Status   Specimen Description BLOOD SITE NOT SPECIFIED  Final   Special Requests  Final    BOTTLES DRAWN AEROBIC AND ANAEROBIC Blood Culture adequate volume   Culture   Final    NO GROWTH 3 DAYS Performed at [facility], [address], [place], [place] [address]    Report Status PENDING  Incomplete  Respiratory Panel by RT PCR (Flu A&B, Covid) - Nasopharyngeal Swab     Status: None   Collection Time: 07/17/20  6:00 PM   Specimen: Nasopharyngeal Swab  Result Value Ref Range Status   SARS Coronavirus 2 by RT PCR NEGATIVE NEGATIVE Final    Comment: (NOTE) SARS-CoV-2 target nucleic acids are NOT DETECTED.  The SARS-CoV-2 RNA is generally detectable in upper respiratoy specimens during the acute phase of infection. The lowest concentration of SARS-CoV-2 viral copies this assay can detect is 131 copies/mL. A negative result does not preclude SARS-Cov-2 infection and should not be used as the sole basis for treatment or other patient management decisions. A negative result may occur with  improper specimen collection/handling, submission of specimen other than nasopharyngeal swab, presence of viral mutation(s) within the areas targeted by this assay, and inadequate number of viral copies (<131 copies/mL). A negative result must be combined with clinical observations, patient history, and epidemiological information. The expected result is Negative.  Fact Sheet for Patients:  https://www.moore.com/  Fact Sheet for Healthcare Providers:  https://www.young.biz/  This test is no t yet approved or cleared by the [place] FDA and  has been authorized for detection and/or diagnosis of SARS-CoV-2 by FDA under an Emergency Use Authorization (EUA). This EUA will remain  in effect (meaning this test can be used) for the duration of the COVID-19 declaration under Section 564(b)(1) of the Act, 21 U.S.C. section 360bbb-3(b)(1), unless the authorization is terminated or revoked sooner.     Influenza A by PCR NEGATIVE NEGATIVE Final   Influenza B by PCR NEGATIVE NEGATIVE Final    Comment: (NOTE) The Xpert Xpress SARS-CoV-2/FLU/RSV assay is intended as an aid in  the diagnosis of influenza from Nasopharyngeal swab specimens and  should not be used as a sole basis for treatment. Nasal washings and  aspirates are unacceptable for Xpert Xpress SARS-CoV-2/FLU/RSV  testing.  Fact Sheet for Patients: https://www.moore.com/  Fact Sheet for Healthcare Providers: https://www.young.biz/  This test is not yet approved or cleared by the [place] FDA and  has been authorized for detection and/or diagnosis of SARS-CoV-2 by  FDA under an Emergency Use Authorization (EUA). This EUA will remain  in effect (meaning this test can be used) for the duration of the  Covid-19 declaration under Section 564(b)(1) of the Act, 21  U.S.C. section 360bbb-3(b)(1), unless the authorization is  terminated or revoked. Performed at [facility], [address] [place], [place] [address]          Radiology Studies: [place] CORE BIOPSY (LYMPH NODES)  Result Date: 07/19/2020 INDICATION: 34 year old female referred for biopsy of axillary lymph node EXAM: IMAGE GUIDED BIOPSY LEFT AXILLARY LYMPH NODE MEDICATIONS: None. ANESTHESIA/SEDATION: Moderate (conscious) sedation was employed during this procedure. A total of Versed 1.0 mg and Fentanyl 25 mcg was administered intravenously. Moderate Sedation Time: 10 minutes. The patient's level of consciousness and vital signs were monitored  continuously by radiology nursing throughout the procedure under my direct supervision. FLUOROSCOPY TIME:  Ultrasound COMPLICATIONS: None immediate. PROCEDURE: Informed written consent was obtained from the patient after a thorough discussion of the procedural risks, benefits and alternatives. All questions were addressed. Maximal Sterile Barrier Technique was  utilized including caps, mask, sterile gowns, sterile gloves, sterile drape, hand hygiene and skin antiseptic. A timeout was performed prior to the initiation of the procedure. Ultrasound survey was performed with images stored and sent to PACs. The left axillary region was prepped with chlorhexidine in a sterile fashion, and a sterile drape was applied covering the operative field. A sterile gown and sterile gloves were used for the procedure. Local anesthesia was provided with 1% Lidocaine. Ultrasound guidance was used to infiltrate the region with 1% lidocaine for local anesthesia. Small stab incision was made with 11 blade scalpel. Four separate 16 gauge core biopsy were then acquired of the left axillary node using ultrasound guidance. Images were stored. Final image was stored after biopsy. Patient tolerated the procedure well and remained hemodynamically stable throughout. No complications were encountered and no significant blood loss was encounter IMPRESSION: Status post ultrasound-guided core biopsy of left axillary lymph node. Signed, Yvone Neu. Reyne Dumas, RPVI [facility] Electronically Signed   By: Gilmer Mor D.O.   On: 07/19/2020 16:56        Scheduled Meds: . sodium chloride   Intravenous Once  . diclofenac Sodium  2 g Topical QID  . methocarbamol  500 mg Oral TID   Continuous Infusions:   LOS: 2 days    Time spent: Marlin Canary DO [facility]  07/20/2020, 3:20 PM

## 2020-07-20 NOTE — TOC Initial Note (Signed)
Transition of Care [facility]) - Initial/Assessment Note    Patient Details  Name: Katie Middleton MRN: [id number] Date of Birth: 10-10-1985  Transition of [facility] CM/SW Contact:    Kingsley Plan, RN Phone Number: 07/20/2020, 10:38 AM  Clinical Narrative:                 Patient from home independent.   Confirmed with patient she has Medicaid. Card not scanned into system. Patient does not have card with her. She does not know who her PCP is. Discussed scheduling a hospital follow up appointment with a [facility]. Patient in agreement.   Patient Care Center first available August 30, 2020 at 0920. Placed on AVS.  Expected Discharge Plan: Home/Self Care Barriers to Discharge: Continued Medical Work up   Patient Goals and CMS Choice Patient states their goals for this hospitalization and ongoing recovery are:: to return to home CMS Medicare.gov Compare Post Acute Care list provided to:: Patient Choice offered to / list presented to : NA  Expected Discharge Plan and Services Expected Discharge Plan: Home/Self Care   Discharge Planning Services: CM Consult   Living arrangements for the past 2 months: Single Family Home                 DME Arranged: N/A DME Agency: NA       HH Arranged: NA          Prior Living Arrangements/Services Living arrangements for the past 2 months: Single Family Home   Patient language and need for interpreter reviewed:: Yes        Need for Family Participation in Patient Care: Yes (Comment) Care giver support system in place?: Yes (comment)   Criminal Activity/Legal Involvement Pertinent to Current Situation/Hospitalization: No - Comment as needed  Activities of Daily Living Home Assistive Devices/Equipment: None ADL Screening (condition at time of admission) Patient's cognitive ability adequate to safely complete daily activities?: Yes Is the patient deaf or have difficulty hearing?: No Does the patient have difficulty seeing, even  when wearing glasses/contacts?: No Does the patient have difficulty concentrating, remembering, or making decisions?: No Patient able to express need for assistance with ADLs?: Yes Does the patient have difficulty dressing or bathing?: No Independently performs ADLs?: Yes (appropriate for developmental age) Does the patient have difficulty walking or climbing stairs?: No Weakness of Legs: None Weakness of Arms/Hands: None  Permission Sought/Granted   Permission granted to share information with : No              Emotional Assessment Appearance:: Appears stated age Attitude/Demeanor/Rapport: Engaged Affect (typically observed): Accepting Orientation: : Oriented to Self, Oriented to Place, Oriented to  Time, Oriented to Situation Alcohol / Substance Use: Not Applicable Psych Involvement: No (comment)  Admission diagnosis:  Shortness of breath [R06.02] Pleuritic chest pain [R07.81] Lymphadenopathy [R59.1] Dyspnea [R06.00] Anemia [D64.9] Hypoxia [R09.02] Abnormal CT of the chest [R93.89] Patient Active Problem List   Diagnosis Date Noted  . Abnormal CT of the chest 07/17/2020  . Anemia 07/17/2020  . Symptomatic anemia 11/12/2018  . Iron deficiency anemia 11/12/2018  . Abdominal pain with vomiting 11/12/2018  . Fluid in endometrial cavity 11/12/2018   PCP:  Patient, No Pcp Per Pharmacy:   [id number] - [place], [place] - [address] [place]  [address] Phone: [phone] Fax: [phone]  [facility] - [place], [place] - [address] [place] [address] Phone: [phone] Fax: [phone]     Social Determinants of Health (SDOH) Interventions    Readmission Risk Interventions No flowsheet data found.

## 2020-07-21 ENCOUNTER — Telehealth (HOSPITAL_COMMUNITY): Payer: Self-pay

## 2020-07-21 LAB — TYPE AND SCREEN
ABO/RH(D): O POS
Antibody Screen: POSITIVE
DAT, IgG: POSITIVE
Unit division: 0
Unit division: 0
Unit division: 0
Unit division: 0

## 2020-07-21 LAB — BPAM RBC
Blood Product Expiration Date: 202112012359
Blood Product Expiration Date: 202112022359
Blood Product Expiration Date: 202112042359
Blood Product Expiration Date: 202112072359
ISSUE DATE / TIME: 202111012343
ISSUE DATE / TIME: 202111022017
Unit Type and Rh: 5100
Unit Type and Rh: 5100
Unit Type and Rh: 5100
Unit Type and Rh: 5100

## 2020-07-21 LAB — CBC
HCT: 26.5 % — ABNORMAL LOW (ref 36.0–46.0)
Hemoglobin: 7.4 g/dL — ABNORMAL LOW (ref 12.0–15.0)
MCH: 19.8 pg — ABNORMAL LOW (ref 26.0–34.0)
MCHC: 27.9 g/dL — ABNORMAL LOW (ref 30.0–36.0)
MCV: 70.9 fL — ABNORMAL LOW (ref 80.0–100.0)
Platelets: 307 10*3/uL (ref 150–400)
RBC: 3.74 MIL/uL — ABNORMAL LOW (ref 3.87–5.11)
RDW: 19.1 % — ABNORMAL HIGH (ref 11.5–15.5)
WBC: 5.8 10*3/uL (ref 4.0–10.5)
nRBC: 0 % (ref 0.0–0.2)

## 2020-07-21 LAB — BASIC METABOLIC PANEL
Anion gap: 8 (ref 5–15)
BUN: <5 mg/dL — ABNORMAL LOW (ref 6–20)
CO2: 21 mmol/L — ABNORMAL LOW (ref 22–32)
Calcium: 8.5 mg/dL — ABNORMAL LOW (ref 8.9–10.3)
Chloride: 107 mmol/L (ref 98–111)
Creatinine, Ser: 0.52 mg/dL (ref 0.44–1.00)
GFR, Estimated: >60 mL/min (ref >60)
Glucose, Bld: 89 mg/dL (ref 70–99)
Potassium: 4.2 mmol/L (ref 3.5–5.1)
Sodium: 136 mmol/L (ref 135–145)

## 2020-07-21 LAB — SURGICAL PATHOLOGY

## 2020-07-21 LAB — MAGNESIUM: Magnesium: 2.1 mg/dL (ref 1.7–2.4)

## 2020-07-21 MED ORDER — HYDROCODONE-ACETAMINOPHEN 5-325 MG PO TABS
1.0000 | ORAL_TABLET | ORAL | 0 refills | Status: AC | PRN
Start: 2020-07-21 — End: 2020-07-26

## 2020-07-21 MED ORDER — DICLOFENAC SODIUM 1 % EX GEL
2.0000 g | Freq: Four times a day (QID) | CUTANEOUS | Status: DC
Start: 2020-07-21 — End: 2024-02-11

## 2020-07-21 MED ORDER — VITAMIN B-12 1000 MCG PO TABS: 1000.0000 ug | ORAL_TABLET | Freq: Every day | ORAL | Status: DC

## 2020-07-21 NOTE — Discharge Summary (Signed)
Physician Discharge Summary  Katie Middleton HOZ:[id number] DOB: 06/23/1986 DOA: 07/17/2020  PCP: Patient, No Pcp Per- [facility]U arranged [facility]  Admit date: 07/17/2020 Discharge date: 07/21/2020  Admitted From: home Discharge disposition: home   Recommendations for Outpatient Follow-Up:   1. Needs another dose of IV Fe 2. Labs pending: Fluorescent treponemal ab(fta)-IgG-bld, ENA+DNA/DS+ANTICH+CENTRO+JO 3. Patient already scheduled for rheumatology follow up   Discharge Diagnosis:   Active Problems:   Abnormal CT of the chest   Anemia    Discharge Condition: Improved.  Diet recommendation:Regular.  Wound care: None.  Code status: Full.   History of Present Illness:   Katie Middleton is a 34 y.o. female with medical history significant of  Anemia sp multiple transfusions, possible Lyme disease, arthritis    Presented with  chest pain on the right of her chest worse with movement and deep breathing. Patient reports for the past year she has been off and on on prednisone for undiagnosed arthritis.  Patient states she has been having pain in her wrists and ankles.  Somebody suggested to her she may have rheumatoid arthritis since then she has been taking prednisone when somebody prescribes it to her usually obtained from the emergency department. She has been off of prednisone for at least a month. Patient reports she has noticed lymph nodes under her armpit for quite some time but have not paid much attention to them. Reports today she have had an episode of diaphoresis and generally not feeling well. No fevers or chills.  No cough Patient used to work for health system. Lyme disease 06/05/2017   Review of records in 2020 patient had IgM bands positive, along with negative IgG bands, which has been pretty consistent over the past 2+ years, last being assess by Ronda Fairly, MD and completed 4-week course of doxycycline in late 2018. She had what  was felt to be false positive RPR, given negative TPPA, and negative Ehrlichia/Anaplasma PCR, and Spotted fever serology IgG was 1:64, negative IgM serology, with mildly positive ANA, cytoplasmic staining noted, negative RF. she has been diagnosed with Fibromyalgia, however has yet to establish care with a PCP, however has been to ED/Urgent Care multiple times 12/25/18, 01/06/19, 01/25/19, 02/12/19 for arthralgias/myalgias, and had been referred to rheumatology, however was seen after being on steroids for almost 60-months at which time she did not have any appreciated synovitis, or joint effusions, and was instructed to return if any did return.   She has a follow up with rheumatology in 2 days  Patient does states she has chronic anemia requiring blood transfusions in the past she does endorse heavy menstrual periods. She is supposed to be on iron supplementation but has not been taking it regularly. Patient was seen in August in the emergency department and was noted to have hemoglobin down to 5.9 apparently was transfused   Hospital Course by Problem:     Acute on chronic iron deficiency anemia -Transfused 1 unit of PRBC  -Long history of menorrhagia since puberty -Advised gynecology follow-up for this -1 dose of IV iron given 11/3- will need another as an outpatient  Cervical/axillary lymphadenopathy -Etiology unclear, differential is quite broad -FTA-abs pending -HIV negative -EBV and CMV appear to show prior infection/exposure -hematology consult appreciated -IR: s/p biopsy -pathology: .  Results show this is a reactive lymph node and no monoclonal B-cell or phenotypically aberrant T-cell population identified.  do not recommend any additional work-up or follow-up with medical oncology.  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 -checking FTA-abs-- pending (RPR  always positive: ? False positive?)  Hypokalemia/hypomagnesemia -replete both   Medical Consultants:    IR hematology  Discharge Exam:   Vitals:   07/20/20 2350 07/21/20 0626  BP: 110/88 (!) 92/57  Pulse: 80 80  Resp: 18 17  Temp: 98 F (36.7 C) 98 F (36.7 C)  SpO2: 98% 98%   Vitals:   07/20/20 1203 07/20/20 1642 07/20/20 2350 07/21/20 0626  BP: (!) 108/57 (!) 106/55 110/88 (!) 92/57  Pulse: (!) 101 87 80 80  Resp: 20 20 18 17   Temp: 99.1 F (37.3 C) 98.3 F (36.8 C) 98 F (36.7 C) 98 F (36.7 C)  TempSrc: Oral Oral Oral Oral  SpO2: 100% 98% 98% 98%  Weight:      Height:        General exam: Appears calm and comfortable.   The results of significant diagnostics from this hospitalization (including imaging, microbiology, ancillary and laboratory) are listed below for reference.     Procedures and Diagnostic Studies:   DG Chest 2 View  Result Date: 07/17/2020 CLINICAL DATA:  Chest pain. EXAM: CHEST - 2 VIEW COMPARISON:  July 17, 2020. FINDINGS: The heart size and mediastinal contours are within normal limits. Both lungs are clear. No visible pleural effusions or pneumothorax. The visualized skeletal structures are unremarkable. IMPRESSION: No active cardiopulmonary disease. Electronically Signed   By: Feliberto Harts MD   On: 07/17/2020 15:59   CT SOFT TISSUE NECK W CONTRAST  Result Date: 07/18/2020 CLINICAL DATA:  Lymphadenopathy EXAM: CT NECK WITH CONTRAST TECHNIQUE: Multidetector CT imaging of the neck was performed using the standard protocol following the bolus administration of intravenous contrast. CONTRAST:  OMNIPAQUE IOHEXOL 300 MG/ML  SOLN COMPARISON:  None. FINDINGS: Pharynx and larynx: Unremarkable.  No mass or swelling. Salivary glands: Unremarkable. Thyroid: Normal. Lymph nodes: Nonenlarged and some top-normal cervical and supraclavicular lymph nodes. Enlarged bilateral axillary lymph nodes as noted on prior chest CT. Vascular: Major neck  vessels are patent. Limited intracranial: No abnormal enhancement. Visualized orbits: Unremarkable. Mastoids and visualized paranasal sinuses: Minor mucosal thickening. Skeleton: No significant abnormality. Upper chest: Included upper lungs are clear. Refer to recent chest CT. Other: None. IMPRESSION: Indeterminate nonenlarged and some top-normal cervical and supraclavicular lymph nodes. Electronically Signed   By: Guadlupe Spanish M.D.   On: 07/18/2020 15:13   CT Angio Chest PE W/Cm &/Or Wo Cm  Result Date: 07/17/2020 CLINICAL DATA:  Right-sided chest pain since Saturday. EXAM: CT ANGIOGRAPHY CHEST WITH CONTRAST TECHNIQUE: Multidetector CT imaging of the chest was performed using the standard protocol during bolus administration of intravenous contrast. Multiplanar CT image reconstructions and MIPs were obtained to evaluate the vascular anatomy. CONTRAST:  3mL OMNIPAQUE IOHEXOL 350 MG/ML SOLN COMPARISON:  Chest x-ray from same day. CTA chest dated October 20, 2011. FINDINGS: Cardiovascular: Satisfactory opacification of the pulmonary arteries to the segmental level. No evidence of pulmonary embolism. Normal heart size. No pericardial effusion. No thoracic aortic aneurysm or dissection. Mediastinum/Nodes: New multiple enlarged bilateral axillary and subpectoral lymph nodes measuring up to 1.4 cm in short axis on the right and 1.6 cm on the left. No enlarged mediastinal or hilar lymph nodes. The thyroid gland, trachea, and esophagus demonstrate no significant findings. Lungs/Pleura: Trace right pleural effusion with minimal subsegmental atelectasis in the right lower lobe. No focal consolidation  or pneumothorax. Upper Abdomen: No acute abnormality. Musculoskeletal: No chest wall abnormality. No acute or significant osseous findings. Review of the MIP images confirms the above findings. IMPRESSION: 1. New multiple enlarged bilateral axillary and subpectoral lymph nodes measuring up to 1.6 cm, concerning for  lymphoma. These are amenable to tissue sampling. 2. No evidence of pulmonary embolism. 3. Trace right pleural effusion. Electronically Signed   By: Obie Dredge M.D.   On: 07/17/2020 19:33   CT ABDOMEN PELVIS W CONTRAST  Result Date: 07/18/2020 CLINICAL DATA:  Adenopathy in the chest EXAM: CT ABDOMEN AND PELVIS WITH CONTRAST TECHNIQUE: Multidetector CT imaging of the abdomen and pelvis was performed using the standard protocol following bolus administration of intravenous contrast. CONTRAST:  OMNIPAQUE IOHEXOL 300 MG/ML  SOLN COMPARISON:  None. FINDINGS: Lower chest: Bibasilar atelectasis. Hepatobiliary: No focal liver lesion. Increased density within the gallbladder may reflect sludge or vicarious excretion of contrast. Pancreas: Unremarkable. Spleen: Mild splenomegaly measuring 14 cm craniocaudal. Adrenals/Urinary Tract: There is residual contrast within the bladder. Adrenals and kidneys are unremarkable. Stomach/Bowel: Stomach is within normal limits. Bowel is normal in caliber. Vascular/Lymphatic: No significant vascular findings are present. Bilateral iliac and inguinal adenopathy. For example, left common iliac node measures 1 cm short axis on series 3, image 47. Left external iliac node measures 2.6 x 1.4 cm on image 70. Reproductive: Uterus is unremarkable. Right adnexal cystic lesion measuring 5.6 cm. Other: Small umbilical fat containing hernia. Musculoskeletal: No acute osseous abnormality. IMPRESSION: Pelvic retroperitoneal and inguinal adenopathy. Mild splenomegaly. Together with findings on prior imaging, lymphoma is suspected. Electronically Signed   By: Guadlupe Spanish M.D.   On: 07/18/2020 15:23   DG CHEST PORT 1 VIEW  Result Date: 07/18/2020 CLINICAL DATA:  Hypoxia. Dyspnea. Additional provided: Onset of shortness of breath and fever yesterday. EXAM: PORTABLE CHEST 1 VIEW COMPARISON:  CT angiogram chest 07/17/2020. FINDINGS: Shallow inspiration radiograph. Heart size within normal  limits. Mild ill-defined opacity within the right greater than left lung bases. No sizable pleural effusion or evidence of pneumothorax. No acute bony abnormality identified. IMPRESSION: Shallow inspiration radiograph. Mild ill-defined opacity within the right greater than left lung bases, which may reflect atelectasis or pneumonia. Repeat PA and lateral chest radiographs with improved inspiratory effort may be helpful for further evaluation. Electronically Signed   By: Jackey Loge DO   On: 07/18/2020 14:17     Labs:   Basic Metabolic Panel: Recent Labs  Lab 07/17/20 1537 07/17/20 1537 07/17/20 2207 07/18/20 0500 07/18/20 0500 07/19/20 0254 07/19/20 0254 07/20/20 0211 07/21/20 0345  NA 135  --   --  136  --  135  --  134* 136  K 3.5   < >  --  3.4*   < > 3.3*   < > 2.6* 4.2  CL 107  --   --  108  --  104  --  103 107  CO2 22  --   --  21*  --  22  --  23 21*  GLUCOSE 100*  --   --  94  --  96  --  126* 89  BUN 7  --   --  5*  --  7  --  6 <5*  CREATININE 0.61  --   --  0.62  --  0.54  --  0.71 0.52  CALCIUM 8.5*  --   --  8.0*  --  8.4*  --  8.6* 8.5*  MG  --   --  1.7 1.7  --   --   --  1.7 2.1  PHOS  --   --  3.2 3.3  --   --   --   --   --    < > = values in this interval not displayed.   GFR Estimated Creatinine Clearance: 88.8 mL/min (by C-G formula based on SCr of 0.52 mg/dL). Liver Function Tests: Recent Labs  Lab 07/17/20 1755 07/17/20 2207 07/18/20 0500 07/19/20 0254  AST ALT ALKPHOS 61 49 49 46  BILITOT 0.6 0.6 1.1 0.3  PROT 8.3* 6.9 7.1 7.0  ALBUMIN 3.3* 2.7* 2.7* 2.7*   No results for input(s): LIPASE, AMYLASE in the last 168 hours. No results for input(s): AMMONIA in the last 168 hours. Coagulation profile Recent Labs  Lab 07/17/20 1755 07/17/20 1834  INR NOT CALCULATED 1.2    CBC: Recent Labs  Lab 07/17/20 2207 07/18/20 0500 07/19/20 0254 07/20/20 0211 07/21/20 0345  WBC 6.9 8.5 9.5 7.8 5.8  NEUTROABS 5.7 7.3  --    --   --   HGB 6.3* 7.4* 7.1* 8.1* 7.4*  HCT 23.9* 26.5* 25.0* 27.9* 26.5*  MCV 69.5* 71.0* 71.0* 69.6* 70.9*  PLT 262 253 249 314 307   Cardiac Enzymes: Recent Labs  Lab 07/17/20 2207  CKTOTAL 13*   BNP: Invalid input(s): POCBNP CBG: No results for input(s): GLUCAP in the last 168 hours. D-Dimer No results for input(s): DDIMER in the last 72 hours. Hgb A1c No results for input(s): HGBA1C in the last 72 hours. Lipid Profile No results for input(s): CHOL, HDL, LDLCALC, TRIG, CHOLHDL, LDLDIRECT in the last 72 hours. Thyroid function studies No results for input(s): TSH, T4TOTAL, T3FREE, THYROIDAB in the last 72 hours.  Invalid input(s): FREET3 Anemia work up No results for input(s): VITAMINB12, FOLATE, FERRITIN, TIBC, IRON, RETICCTPCT in the last 72 hours. Microbiology Recent Results (from the past 240 hour(s))  Blood culture (routine single)     Status: None (Preliminary result)   Collection Time: 07/17/20  5:36 PM   Specimen: BLOOD  Result Value Ref Range Status   Specimen Description BLOOD SITE NOT SPECIFIED  Final   Special Requests   Final    BOTTLES DRAWN AEROBIC AND ANAEROBIC Blood Culture adequate volume   Culture   Final    NO GROWTH 4 DAYS Performed at [facility], [address], [place], [place] [address]    Report Status PENDING  Incomplete  Respiratory Panel by RT PCR (Flu A&B, Covid) - Nasopharyngeal Swab     Status: None   Collection Time: 07/17/20  6:00 PM   Specimen: Nasopharyngeal Swab  Result Value Ref Range Status   SARS Coronavirus 2 by RT PCR NEGATIVE NEGATIVE Final    Comment: (NOTE) SARS-CoV-2 target nucleic acids are NOT DETECTED.  The SARS-CoV-2 RNA is generally detectable in upper respiratoy specimens during the acute phase of infection. The lowest concentration of SARS-CoV-2 viral copies this assay can detect is 131 copies/mL. A negative result does not preclude SARS-Cov-2 infection and should not be used as the sole basis for  treatment or other patient management decisions. A negative result may occur with  improper specimen collection/handling, submission of specimen other than nasopharyngeal swab, presence of viral mutation(s) within the areas targeted by this assay, and inadequate number of viral copies (<131 copies/mL). A negative result must be combined with clinical observations, patient history, and epidemiological information. The expected  result is Negative.  Fact Sheet for Patients:  https://www.moore.com/  Fact Sheet for Healthcare Providers:  https://www.young.biz/  This test is no t yet approved or cleared by the [place] FDA and  has been authorized for detection and/or diagnosis of SARS-CoV-2 by FDA under an Emergency Use Authorization (EUA). This EUA will remain  in effect (meaning this test can be used) for the duration of the COVID-19 declaration under Section 564(b)(1) of the Act, 21 U.S.C. section 360bbb-3(b)(1), unless the authorization is terminated or revoked sooner.     Influenza A by PCR NEGATIVE NEGATIVE Final   Influenza B by PCR NEGATIVE NEGATIVE Final    Comment: (NOTE) The Xpert Xpress SARS-CoV-2/FLU/RSV assay is intended as an aid in  the diagnosis of influenza from Nasopharyngeal swab specimens and  should not be used as a sole basis for treatment. Nasal washings and  aspirates are unacceptable for Xpert Xpress SARS-CoV-2/FLU/RSV  testing.  Fact Sheet for Patients: https://www.moore.com/  Fact Sheet for Healthcare Providers: https://www.young.biz/  This test is not yet approved or cleared by the [place] FDA and  has been authorized for detection and/or diagnosis of SARS-CoV-2 by  FDA under an Emergency Use Authorization (EUA). This EUA will remain  in effect (meaning this test can be used) for the duration of the  Covid-19 declaration under Section 564(b)(1) of the Act, 21    U.S.C. section 360bbb-3(b)(1), unless the authorization is  terminated or revoked. Performed at [facility], [address] [place], [place] [address]      Discharge Instructions:   Discharge Instructions    Diet general   Complete by: As directed    Discharge instructions   Complete by: As directed    Bowel regimen while on pain meds as these can cause constipation Do not drive/work while taking pain meds Close follow up with rheumatology, PCP and hematology (need another dose of IV Fe).  Will need to follow up with gyn as well   Increase activity slowly   Complete by: As directed    No dressing needed   Complete by: As directed      Allergies as of 07/21/2020      Reactions   Bactrim [sulfamethoxazole-trimethoprim] Other (See Comments)   "BLE hurts really, really bad"   Penicillins Anaphylaxis   Did it involve swelling of the face/tongue/throat, SOB, or low BP? Unknown Did it involve sudden or severe rash/hives, skin peeling, or any reaction on the inside of your mouth or nose? Unknown Did you need to seek medical attention at a hospital or doctor's office? Unknown When did it last happen?at infancy If all above answers are "NO", may proceed with cephalosporin use.   Amoxicillin    Bactroban [mupirocin Calcium] Hives      Medication List    STOP taking these medications   diclofenac 75 MG EC tablet Commonly known as: VOLTAREN   predniSONE 10 MG (21) Tbpk tablet Commonly known as: STERAPRED UNI-PAK 21 TAB     TAKE these medications   diclofenac Sodium 1 % Gel Commonly known as: VOLTAREN Apply 2 g topically 4 (four) times daily.   HYDROcodone-acetaminophen 5-325 MG tablet Commonly known as: NORCO/VICODIN Take 1 tablet by mouth every 4 (four) hours as needed for up to 5 days for moderate pain.   vitamin B-12 1000 MCG tablet Commonly known as: CYANOCOBALAMIN Take 1 tablet (1,000 mcg total) by mouth daily.            Discharge Care  Instructions  (From admission, onward)         Start     Ordered   07/21/20 0000  No dressing needed        07/21/20 1223          Follow-up Information    [facility] Follow up.   Specialty: Internal Medicine Why: Appointment August 30, 2020 at 0920 am, if you can not make it please call to reschedule   Contact information: [address] [id number] [address] [address] [phone]               Time coordinating discharge: 35 min  Signed:  Joseph Art DO  [facility] 07/21/2020, 12:23 PM

## 2020-07-21 NOTE — Progress Notes (Signed)
HEMATOLOGY-ONCOLOGY PROGRESS NOTE  SUBJECTIVE: The patient reports that she feels much better today.  Offers no specific complaints today.  Has already been discharged from the hospitalist and is awaiting paperwork.  REVIEW OF SYSTEMS:   Constitutional: Denies fevers, chills Eyes: Denies blurriness of vision Ears, nose, mouth, throat, and face: Denies mucositis or sore throat Respiratory: Denies cough, dyspnea or wheezes Cardiovascular: Denies palpitation, chest discomfort Gastrointestinal:  Denies nausea, heartburn or change in bowel habits Skin: Denies abnormal skin rashes Lymphatics: Denies easy bruising Neurological:Denies numbness, tingling or new weaknesses Behavioral/Psych: Mood is stable, no new changes  Extremities: No lower extremity edema All other systems were reviewed with the patient and are negative.  I have reviewed the past medical history, past surgical history, social history and family history with the patient and they are unchanged from previous note.   PHYSICAL EXAMINATION:  Vitals:   07/20/20 2350 07/21/20 0626  BP: 110/88 (!) 92/57  Pulse: 80 80  Resp: 18 17  Temp: 98 F (36.7 C) 98 F (36.7 C)  SpO2: 98% 98%   Filed Weights   07/17/20 1525  Weight: 63.5 kg    Intake/Output from previous day: No intake/output data recorded.  GENERAL:alert, no distress and comfortable  NEURO: alert & oriented x 3 with fluent speech, no focal motor/sensory deficits  LABORATORY DATA:  I have reviewed the data as listed CMP Latest Ref Rng & Units 07/21/2020 07/20/2020 07/19/2020  Glucose 70 - 99 mg/dL 89 527(P) 96  BUN 6 - 20 mg/dL <8(E) 6 7  Creatinine 4.23 - 1.00 mg/dL 5.36 1.44 3.15  Sodium 135 - 145 mmol/L 136 134(L) 135  Potassium 3.5 - 5.1 mmol/L 4.2 2.6(LL) 3.3(L)  Chloride 98 - 111 mmol/L 107 103 104  CO2 22 - 32 mmol/L 21(L) 23 22  Calcium 8.9 - 10.3 mg/dL 4.0(G) 8.6(P) 6.1(P)  Total Protein 6.5 - 8.1 g/dL - - 7.0  Total Bilirubin 0.3 - 1.2 mg/dL - -  0.3  Alkaline Phos 38 - 126 U/L - - 46  AST 15 - 41 U/L - - 16  ALT 0 - 44 U/L - - 8    Lab Results  Component Value Date   WBC 5.8 07/21/2020   HGB 7.4 (L) 07/21/2020   HCT 26.5 (L) 07/21/2020   MCV 70.9 (L) 07/21/2020   PLT 307 07/21/2020   NEUTROABS 7.3 07/18/2020    DG Chest 2 View  Result Date: 07/17/2020 CLINICAL DATA:  Chest pain. EXAM: CHEST - 2 VIEW COMPARISON:  July 17, 2020. FINDINGS: The heart size and mediastinal contours are within normal limits. Both lungs are clear. No visible pleural effusions or pneumothorax. The visualized skeletal structures are unremarkable. IMPRESSION: No active cardiopulmonary disease. Electronically Signed   By: Feliberto Harts MD   On: 07/17/2020 15:59   CT SOFT TISSUE NECK W CONTRAST  Result Date: 07/18/2020 CLINICAL DATA:  Lymphadenopathy EXAM: CT NECK WITH CONTRAST TECHNIQUE: Multidetector CT imaging of the neck was performed using the standard protocol following the bolus administration of intravenous contrast. CONTRAST:  OMNIPAQUE IOHEXOL 300 MG/ML  SOLN COMPARISON:  None. FINDINGS: Pharynx and larynx: Unremarkable.  No mass or swelling. Salivary glands: Unremarkable. Thyroid: Normal. Lymph nodes: Nonenlarged and some top-normal cervical and supraclavicular lymph nodes. Enlarged bilateral axillary lymph nodes as noted on prior chest CT. Vascular: Major neck vessels are patent. Limited intracranial: No abnormal enhancement. Visualized orbits: Unremarkable. Mastoids and visualized paranasal sinuses: Minor mucosal thickening. Skeleton: No significant abnormality. Upper chest: Included  upper lungs are clear. Refer to recent chest CT. Other: None. IMPRESSION: Indeterminate nonenlarged and some top-normal cervical and supraclavicular lymph nodes. Electronically Signed   By: Guadlupe Spanish M.D.   On: 07/18/2020 15:13   CT Angio Chest PE W/Cm &/Or Wo Cm  Result Date: 07/17/2020 CLINICAL DATA:  Right-sided chest pain since Saturday. EXAM: CT  ANGIOGRAPHY CHEST WITH CONTRAST TECHNIQUE: Multidetector CT imaging of the chest was performed using the standard protocol during bolus administration of intravenous contrast. Multiplanar CT image reconstructions and MIPs were obtained to evaluate the vascular anatomy. CONTRAST:  21mL OMNIPAQUE IOHEXOL 350 MG/ML SOLN COMPARISON:  Chest x-ray from same day. CTA chest dated October 20, 2011. FINDINGS: Cardiovascular: Satisfactory opacification of the pulmonary arteries to the segmental level. No evidence of pulmonary embolism. Normal heart size. No pericardial effusion. No thoracic aortic aneurysm or dissection. Mediastinum/Nodes: New multiple enlarged bilateral axillary and subpectoral lymph nodes measuring up to 1.4 cm in short axis on the right and 1.6 cm on the left. No enlarged mediastinal or hilar lymph nodes. The thyroid gland, trachea, and esophagus demonstrate no significant findings. Lungs/Pleura: Trace right pleural effusion with minimal subsegmental atelectasis in the right lower lobe. No focal consolidation or pneumothorax. Upper Abdomen: No acute abnormality. Musculoskeletal: No chest wall abnormality. No acute or significant osseous findings. Review of the MIP images confirms the above findings. IMPRESSION: 1. New multiple enlarged bilateral axillary and subpectoral lymph nodes measuring up to 1.6 cm, concerning for lymphoma. These are amenable to tissue sampling. 2. No evidence of pulmonary embolism. 3. Trace right pleural effusion. Electronically Signed   By: Obie Dredge M.D.   On: 07/17/2020 19:33   CT ABDOMEN PELVIS W CONTRAST  Result Date: 07/18/2020 CLINICAL DATA:  Adenopathy in the chest EXAM: CT ABDOMEN AND PELVIS WITH CONTRAST TECHNIQUE: Multidetector CT imaging of the abdomen and pelvis was performed using the standard protocol following bolus administration of intravenous contrast. CONTRAST:  OMNIPAQUE IOHEXOL 300 MG/ML  SOLN COMPARISON:  None. FINDINGS: Lower chest: Bibasilar  atelectasis. Hepatobiliary: No focal liver lesion. Increased density within the gallbladder may reflect sludge or vicarious excretion of contrast. Pancreas: Unremarkable. Spleen: Mild splenomegaly measuring 14 cm craniocaudal. Adrenals/Urinary Tract: There is residual contrast within the bladder. Adrenals and kidneys are unremarkable. Stomach/Bowel: Stomach is within normal limits. Bowel is normal in caliber. Vascular/Lymphatic: No significant vascular findings are present. Bilateral iliac and inguinal adenopathy. For example, left common iliac node measures 1 cm short axis on series 3, image 47. Left external iliac node measures 2.6 x 1.4 cm on image 70. Reproductive: Uterus is unremarkable. Right adnexal cystic lesion measuring 5.6 cm. Other: Small umbilical fat containing hernia. Musculoskeletal: No acute osseous abnormality. IMPRESSION: Pelvic retroperitoneal and inguinal adenopathy. Mild splenomegaly. Together with findings on prior imaging, lymphoma is suspected. Electronically Signed   By: Guadlupe Spanish M.D.   On: 07/18/2020 15:23   DG CHEST PORT 1 VIEW  Result Date: 07/18/2020 CLINICAL DATA:  Hypoxia. Dyspnea. Additional provided: Onset of shortness of breath and fever yesterday. EXAM: PORTABLE CHEST 1 VIEW COMPARISON:  CT angiogram chest 07/17/2020. FINDINGS: Shallow inspiration radiograph. Heart size within normal limits. Mild ill-defined opacity within the right greater than left lung bases. No sizable pleural effusion or evidence of pneumothorax. No acute bony abnormality identified. IMPRESSION: Shallow inspiration radiograph. Mild ill-defined opacity within the right greater than left lung bases, which may reflect atelectasis or pneumonia. Repeat PA and lateral chest radiographs with improved inspiratory effort may be helpful for further  evaluation. Electronically Signed   By: Jackey Loge DO   On: 07/18/2020 14:17   [place] CORE BIOPSY (LYMPH NODES)  Result Date: 07/19/2020 INDICATION: 34 year old  female referred for biopsy of axillary lymph node EXAM: IMAGE GUIDED BIOPSY LEFT AXILLARY LYMPH NODE MEDICATIONS: None. ANESTHESIA/SEDATION: Moderate (conscious) sedation was employed during this procedure. A total of Versed 1.0 mg and Fentanyl 25 mcg was administered intravenously. Moderate Sedation Time: 10 minutes. The patient's level of consciousness and vital signs were monitored continuously by radiology nursing throughout the procedure under my direct supervision. FLUOROSCOPY TIME:  Ultrasound COMPLICATIONS: None immediate. PROCEDURE: Informed written consent was obtained from the patient after a thorough discussion of the procedural risks, benefits and alternatives. All questions were addressed. Maximal Sterile Barrier Technique was utilized including caps, mask, sterile gowns, sterile gloves, sterile drape, hand hygiene and skin antiseptic. A timeout was performed prior to the initiation of the procedure. Ultrasound survey was performed with images stored and sent to PACs. The left axillary region was prepped with chlorhexidine in a sterile fashion, and a sterile drape was applied covering the operative field. A sterile gown and sterile gloves were used for the procedure. Local anesthesia was provided with 1% Lidocaine. Ultrasound guidance was used to infiltrate the region with 1% lidocaine for local anesthesia. Small stab incision was made with 11 blade scalpel. Four separate 16 gauge core biopsy were then acquired of the left axillary node using ultrasound guidance. Images were stored. Final image was stored after biopsy. Patient tolerated the procedure well and remained hemodynamically stable throughout. No complications were encountered and no significant blood loss was encounter IMPRESSION: Status post ultrasound-guided core biopsy of left axillary lymph node. Signed, Yvone Neu. Reyne Dumas, RPVI [facility] Electronically Signed   By: Gilmer Mor D.O.   On: 07/19/2020 16:56    ASSESSMENT AND PLAN: This is a 34 year old female with 1.  Bilateral axillary lymphadenopathy 2.  Microcytic anemia due to iron deficiency 3.  Lyme disease 4.  Polyarthritis secondary #3  -Discussed biopsy of the left axillary lymph node with the patient.  Results show this is a reactive lymph node and no monoclonal B-cell or phenotypically aberrant T-cell population identified.  We do not recommend any additional work-up or follow-up with medical oncology. -The patient's iron deficiency anemia is overall stable.  Recommend outpatient follow-up with her primary care provider.  Medical oncology will sign off at this time.   LOS: 3 days   Clenton Pare, DNP, AGPCNP-BC, AOCNP 07/21/20

## 2020-07-21 NOTE — Progress Notes (Signed)
Pt IV removed, cathter intact. Pt understands d/c instructions. Pt has all belongings. Pt d/c via RN.

## 2020-07-22 LAB — CULTURE, BLOOD (SINGLE)
Culture: NO GROWTH
Special Requests: ADEQUATE

## 2020-07-25 LAB — FLUORESCENT TREPONEMAL AB(FTA)-IGG-BLD

## 2020-08-30 ENCOUNTER — Ambulatory Visit: Payer: Medicaid Other | Admitting: Family Medicine

## 2020-09-21 ENCOUNTER — Emergency Department (HOSPITAL_BASED_OUTPATIENT_CLINIC_OR_DEPARTMENT_OTHER)
Admission: EM | Admit: 2020-09-21 | Discharge: 2020-09-21 | Disposition: A | Discharge status: Home / Self Care | Payer: Medicaid Other | Attending: Emergency Medicine | Admitting: Emergency Medicine

## 2020-09-21 ENCOUNTER — Other Ambulatory Visit: Payer: Self-pay

## 2020-09-21 ENCOUNTER — Encounter (HOSPITAL_BASED_OUTPATIENT_CLINIC_OR_DEPARTMENT_OTHER): Payer: Self-pay | Admitting: *Deleted

## 2020-09-21 ENCOUNTER — Other Ambulatory Visit (HOSPITAL_BASED_OUTPATIENT_CLINIC_OR_DEPARTMENT_OTHER): Payer: Self-pay | Admitting: Student

## 2020-09-21 DIAGNOSIS — Z87891 Personal history of nicotine dependence: Secondary | ICD-10-CM | POA: Diagnosis not present

## 2020-09-21 DIAGNOSIS — Z760 Encounter for issue of repeat prescription: Secondary | ICD-10-CM | POA: Insufficient documentation

## 2020-09-21 MED ORDER — PREDNISONE 5 MG PO TABS: 5.0000 mg | ORAL_TABLET | Freq: Every day | ORAL | 0 refills | Status: DC

## 2020-09-21 MED FILL — predniSONE 5 MG TABS: 5 | 7 days supply | Qty: 7 | Fill #0

## 2020-09-21 NOTE — ED Provider Notes (Signed)
[facility] EMERGENCY DEPARTMENT Provider Note   CSN: [id number] Arrival date & time: 09/21/20  1114     History Chief Complaint  Patient presents with  . Medication Refill    Katie Middleton is a 35 y.o. female.  HPI   Patient with significant medical history of lupus presents to the emergency department chief complaint of medication refill.  Patient states she was recent diagnosed with with lupus last month and was started on a low-dose of steroids per her doctor.  Patient states she has taken more than she was prescribed and has run out before her next refill which is next Friday.  She states she is having worsening pain in her knees and hands, she denies  recent traumas to the areas.  She states she has been out of her medication for last 4 days and states that the prednisone really helps it.  She contacted her rheumatoid arthritis doctor who prescribed her another medication which does not seem to help.  She states she needs something to help with the pain until she picks up her next refill of steroids.  She has no other complaints at this time.  She denies IV drug use, denies systemic rashes on her body, fevers, chills, chest pain, shortness of breath, abdominal pain, nausea, vomiting, diarrhea, pedal edema.   Past Medical History:  Diagnosis Date  . Anemia   . Lyme disease   . Rheumatoid arthritis [facility])     Patient Active Problem List   Diagnosis Date Noted  . Abnormal CT of the chest 07/17/2020  . Anemia 07/17/2020  . Symptomatic anemia 11/12/2018  . Iron deficiency anemia 11/12/2018  . Abdominal pain with vomiting 11/12/2018  . Fluid in endometrial cavity 11/12/2018    Past Surgical History:  Procedure Laterality Date  . CESAREAN SECTION    . TUBAL LIGATION       OB History    Gravida  4   Para  3   Term      Preterm      AB  1   Living        SAB      IAB      Ectopic      Multiple      Live Births              Family History   Adopted: Yes  Problem Relation Age of Onset  . Heart attack Father        patient told me she was adopted and does not know her family history    Social History   Tobacco Use  . Smoking status: Former Smoker    Types: Cigarettes    Quit date: 12/15/2018    Years since quitting: 1.7  . Smokeless tobacco: Never Used  Vaping Use  . Vaping Use: Never used  Substance Use Topics  . Alcohol use: Yes    Comment: occ  . Drug use: No    Home Medications Prior to Admission medications   Medication Sig Start Date End Date Taking? Authorizing Provider  hydroxychloroquine (PLAQUENIL) 200 MG tablet Take by mouth. 09/18/20 10/18/20 Yes [provider]  predniSONE (DELTASONE) 5 MG tablet Take 15 mg/day x 1 week, then 10 mg/day x 1 week, then 7.5 mg/day x 1 week, then 5 mg daily. 08/31/20  Yes [provider]  predniSONE (DELTASONE) 5 MG tablet Take 1 tablet (5 mg total) by mouth daily for 7 days. 09/21/20 09/28/20 Yes Carroll Sage,  PA-C  diclofenac Sodium (VOLTAREN) 1 % GEL Apply 2 g topically 4 (four) times daily. 07/21/20   Joseph Art, DO  predniSONE (DELTASONE) 10 MG tablet     [provider]  vitamin B-12 (CYANOCOBALAMIN) 1000 MCG tablet Take 1 tablet (1,000 mcg total) by mouth daily. 07/21/20   Joseph Art, DO    Allergies    Bactrim [sulfamethoxazole-trimethoprim], Penicillins, Amoxicillin, and Bactroban [mupirocin calcium]  Review of Systems   Review of Systems  Constitutional: Negative for chills and fever.  HENT: Negative for congestion.   Respiratory: Negative for shortness of breath.   Cardiovascular: Negative for chest pain.  Gastrointestinal: Negative for abdominal pain.  Genitourinary: Negative for enuresis.  Musculoskeletal: Negative for back pain.       Bilateral hand and knee pain  Skin: Negative for rash.  Neurological: Negative for headaches.  Hematological: Does not bruise/bleed easily.    Physical Exam Updated Vital Signs BP  112/78 (BP Location: Right Arm)   Pulse (!) 108   Temp 98.7 F (37.1 C) (Oral)   Resp 18   Ht 5\' 3"  (1.6 m)   Wt 60.8 kg   SpO2 100%   BMI 23.74 kg/m   Physical Exam Vitals and nursing note reviewed.  Constitutional:      General: She is not in acute distress.    Appearance: She is not ill-appearing.  HENT:     Head: Normocephalic and atraumatic.     Nose: No congestion.  Eyes:     Conjunctiva/sclera: Conjunctivae normal.  Cardiovascular:     Rate and Rhythm: Normal rate and regular rhythm.     Pulses: Normal pulses.     Heart sounds: No murmur heard. No friction rub. No gallop.   Pulmonary:     Effort: No respiratory distress.     Breath sounds: No wheezing, rhonchi or rales.  Musculoskeletal:     Right lower leg: No edema.     Left lower leg: No edema.  Skin:    General: Skin is warm and dry.     Findings: No rash.     Comments: Limited skin exam was performed but there is no lacerations, abrasions, ecchymosis, edema or erythema noted on patient's upper or lower extremities.   Neurological:     Mental Status: She is alert.  Psychiatric:        Mood and Affect: Mood normal.     ED Results / Procedures / Treatments   Labs (all labs ordered are listed, but only abnormal results are displayed) Labs Reviewed - No data to display  EKG None  Radiology No results found.  Procedures Procedures (including critical care time)  Medications Ordered in ED Medications - No data to display  ED Course  I have reviewed the triage vital signs and the nursing notes.  Pertinent labs & imaging results that were available during my care of the patient were reviewed by me and considered in my medical decision making (see chart for details).    MDM Rules/Calculators/A&P                          Patient presents with medication refill.  She is alert, does not appear in distress, vital signs reassuring.  Due to well-appearing patient, benign physical and for the lab and  imaging not warranted at this time.  Low suspicion for systemic infection as patient nontoxic-appearing, vital signs reassuring no I suspect on my exam.  Low  suspicion for septic arthritis as there is no erythematous edematous joints on limited exam exam, patient has low risk factors.  Low suspicion for fracture or dislocations as patient denies recent traumas to the area.  Low suspicion for cardiac abnormality requiring immediate intervention as patient has chest pain, shortness of breath, no signs of hypoperfusion fluid overload on my exam.  Due to chronic pain will prescribe patient short low dose of steroids to help bridge her until her next prescription.  I explained to her the dangers of long term use of steroids as it can create adrenal insufficiency and cause long-lasting issues.  Patient was aware of this and states and would still would like steroids.  Will provide her with 5 mg her back 7 days and referred to her rheumatoid arthritis doctor for further evaluation.  Vital signs have remained stable, no indication for hospital admission.  Patient discussed with attending and they agreed with assessment and plan.  Patient given at home care as well strict return precautions.  Patient verbalized that they understood agreed to said plan.  Final Clinical Impression(s) / ED Diagnoses Final diagnoses:  Medication refill    Rx / DC Orders ED Discharge Orders         Ordered    predniSONE (DELTASONE) 5 MG tablet  Daily        09/21/20 1302           Aron Baba 09/21/20 1410    Truddie Hidden, MD 09/21/20 1451

## 2020-09-21 NOTE — ED Triage Notes (Signed)
She ran out of Prednisone that she takes for lupus and is her with request to get a refill. States she knocked her bottle over and spilled her bottle.

## 2020-09-21 NOTE — Discharge Instructions (Addendum)
Seen here for med refill.  Given you 7 days worth of steroids.  Please take as prescribed.  Please be aware that taking steroids for long-term can be dangerous, as they can shut down your natural system to produce steroids.   Only take this medication as prescribed do not take more.    I recommend following up with your rheumatoid arthritis doctor for further management of your lupus.  Come back to the emergency department if you develop chest pain, shortness of breath, severe abdominal pain, uncontrolled nausea, vomiting, diarrhea.

## 2020-09-29 ENCOUNTER — Telehealth: Payer: Self-pay | Admitting: Family Medicine

## 2020-09-29 ENCOUNTER — Telehealth: Payer: Medicaid Other | Admitting: Family Medicine

## 2020-09-29 NOTE — Telephone Encounter (Signed)
Multiple attempts to contact patient today for office visit. Messages left on her voicemail and also her mother's voicemail to contact office.

## 2021-08-03 IMAGING — CT CT ABD-PELV W/ CM
2 of 4 series · 17 of 46 positions shown, 19 images · IV contrast (Omni 300)
Comparison: None.

CLINICAL DATA: Adenopathy in the chest

EXAM:
CT ABDOMEN AND PELVIS WITH CONTRAST
TECHNIQUE: Multidetector CT imaging of the abdomen and pelvis was performed
using the standard protocol following bolus administration of
intravenous contrast.
CONTRAST:  100mL OMNIPAQUE IOHEXOL 300 MG/ML  SOLN

[Series 3: a/p w/ 5mm · axial · 0.85mm/px · z∈[-804,-359]mm · 14 of 97 slices shown, 16 images]
[im 4/97  soft-tissue]
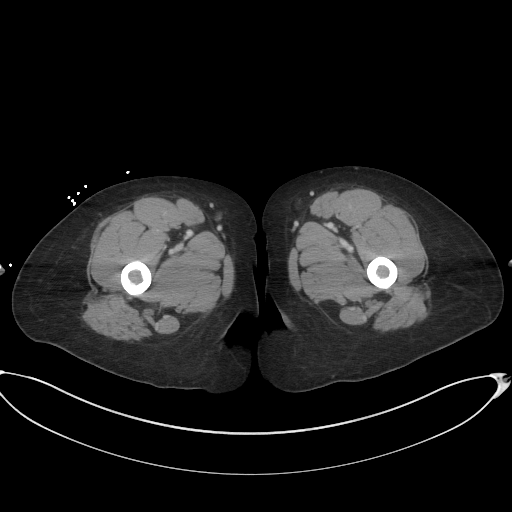
[im 4/97  bone]
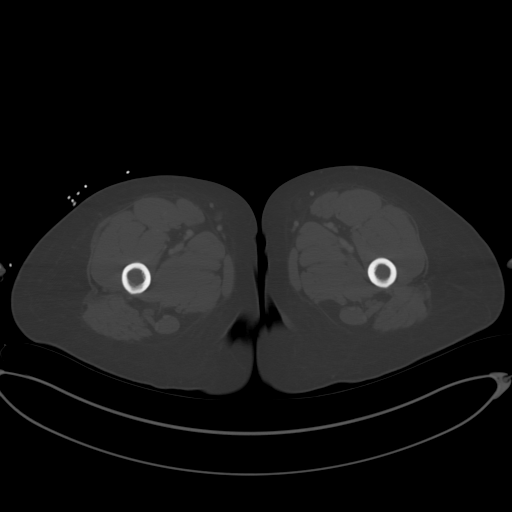
[im 12/97  soft-tissue]
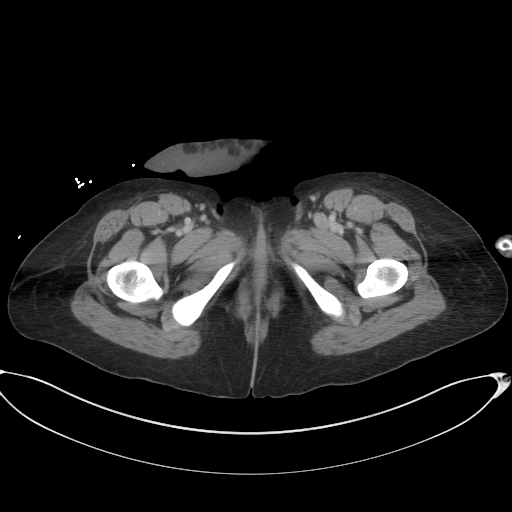
[im 20/97  soft-tissue]
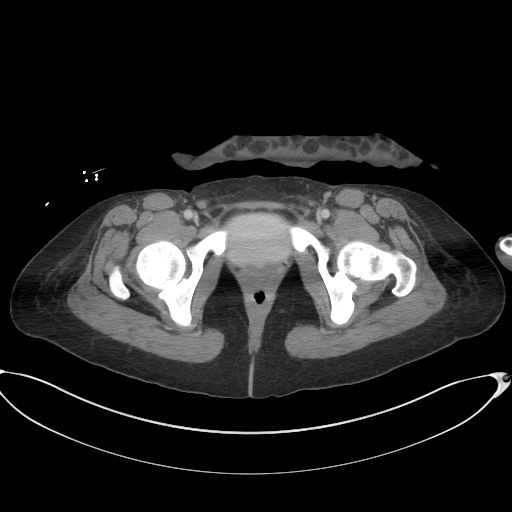
[im 27/97  soft-tissue]
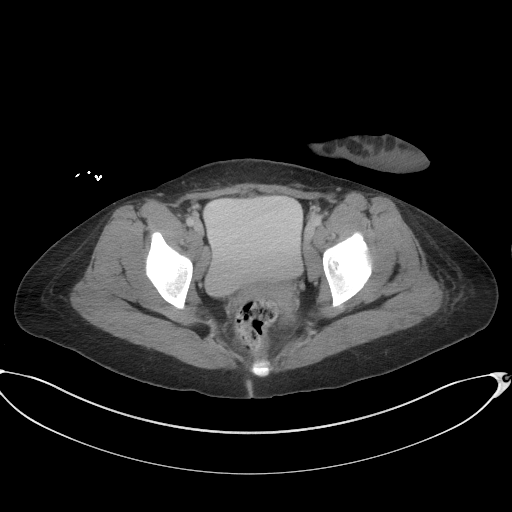
[im 31/97  soft-tissue]
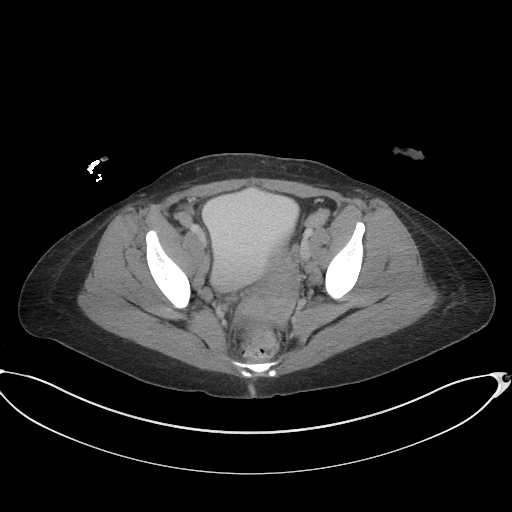
[im 39/97  soft-tissue]
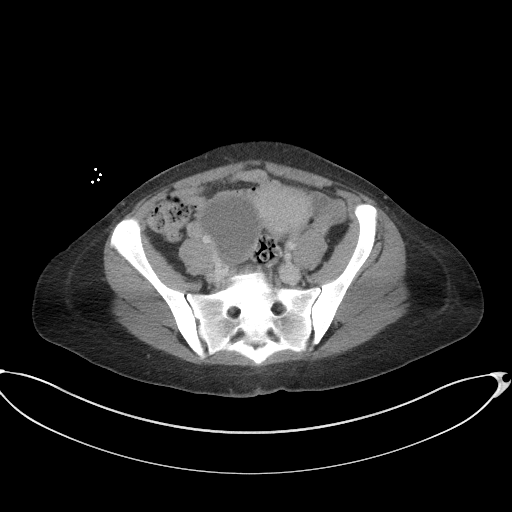
[im 47/97  soft-tissue]
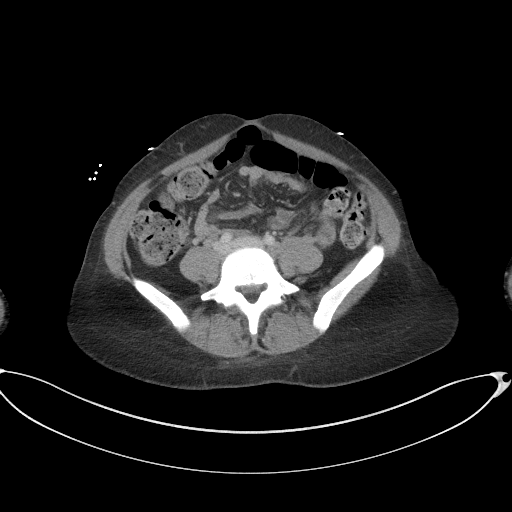
[im 50/97  soft-tissue]
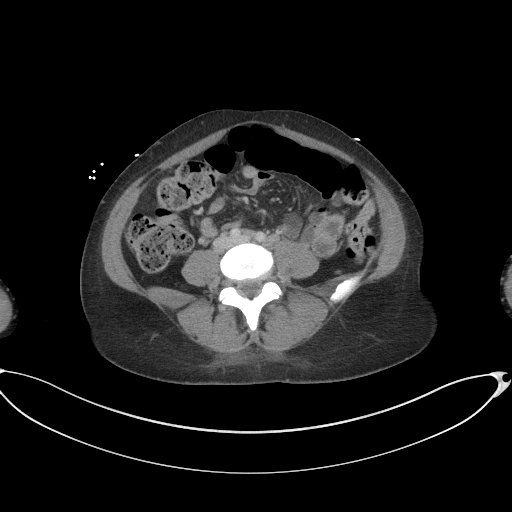
[im 58/97  soft-tissue]
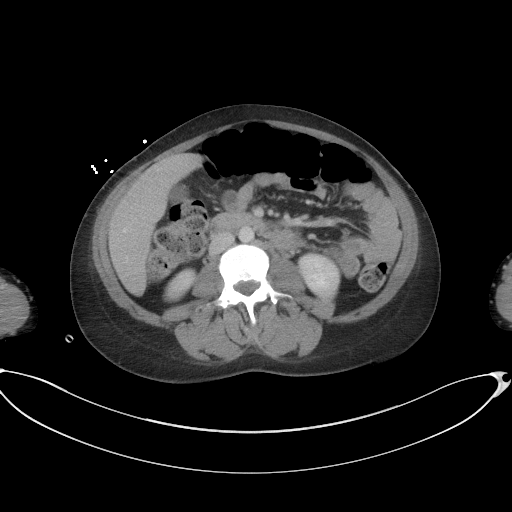
[im 58/97  bone]
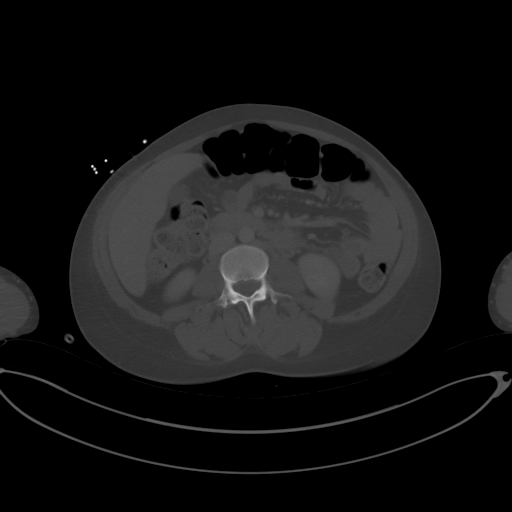
[im 66/97  soft-tissue]
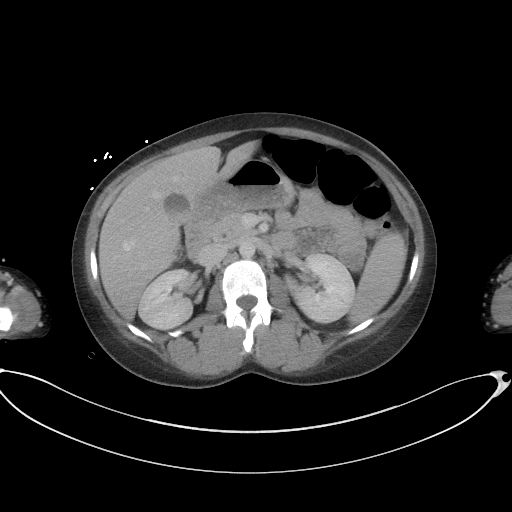
[im 73/97  soft-tissue]
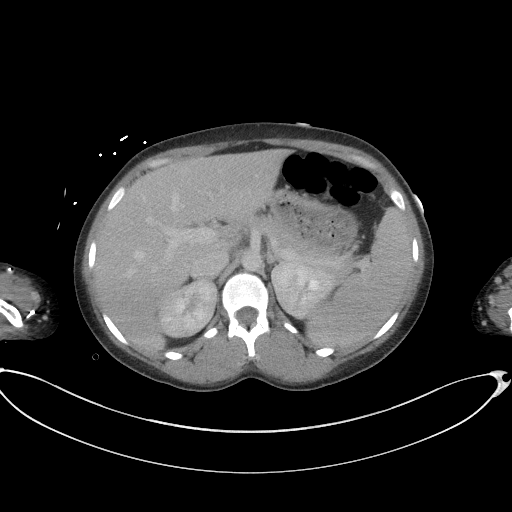
[im 77/97  soft-tissue]
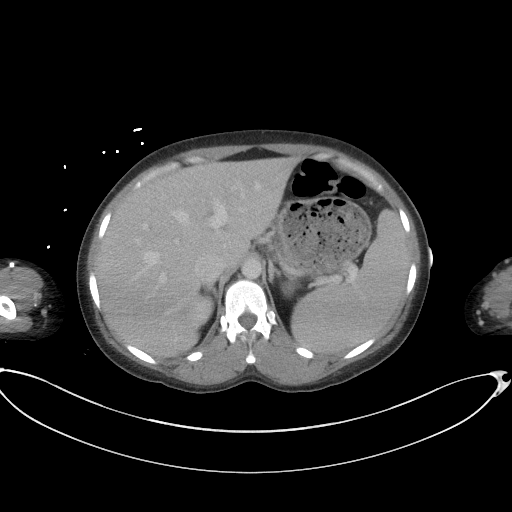
[im 85/97  soft-tissue]
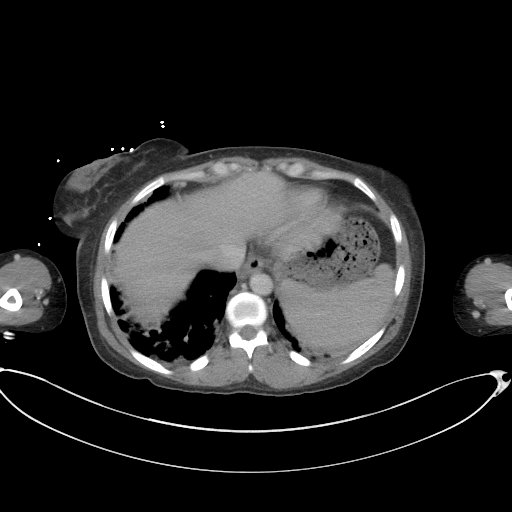
[im 93/97  soft-tissue]
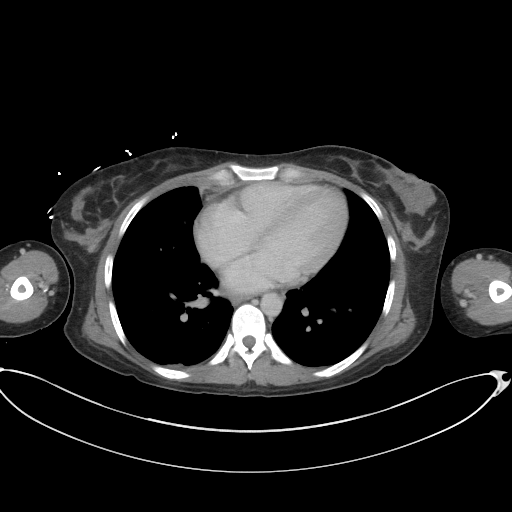

[Series 6: a/p w/ cor · coronal · 0.86mm/px · 3 of 149 slices shown]
[im 50/149  soft-tissue]
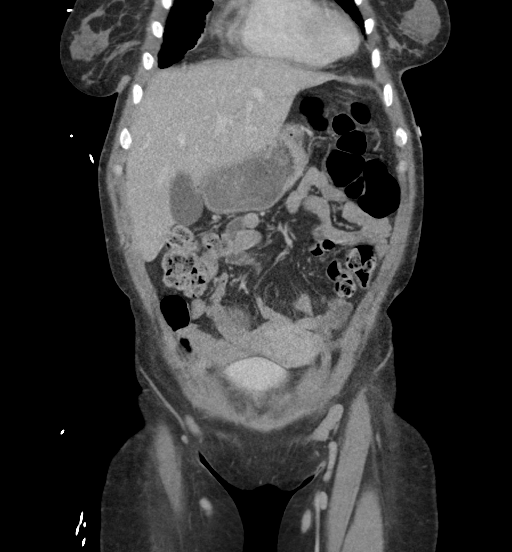
[im 66/149  soft-tissue]
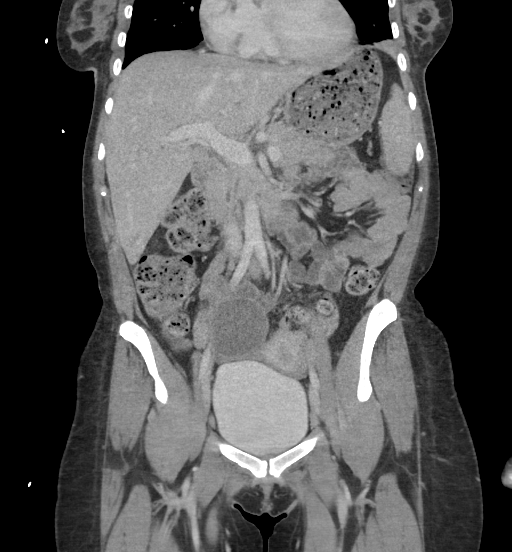
[im 83/149  soft-tissue]
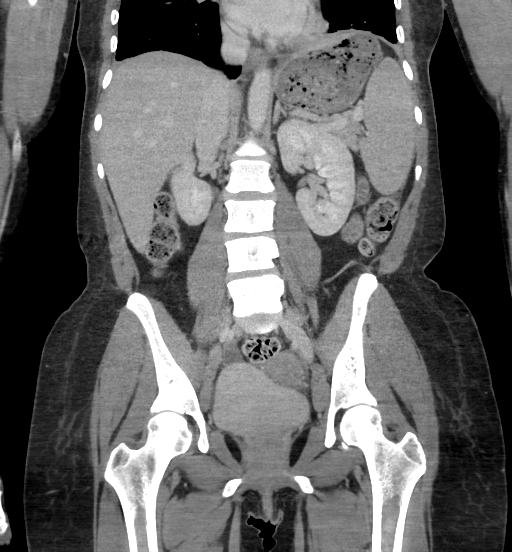

[17 of 46 positions shown; findings below may reference images not displayed]

FINDINGS: Lower chest: Bibasilar atelectasis.

Hepatobiliary: No focal liver lesion. Increased density within the
gallbladder may reflect sludge or vicarious excretion of contrast.

Pancreas: Unremarkable.

Spleen: Mild splenomegaly measuring 14 cm craniocaudal.

Adrenals/Urinary Tract: There is residual contrast within the
bladder. Adrenals and kidneys are unremarkable.

Stomach/Bowel: Stomach is within normal limits. Bowel is normal in
caliber.

Vascular/Lymphatic: No significant vascular findings are present.
Bilateral iliac and inguinal adenopathy. For example, left common
iliac node measures 1 cm short axis on series 3, image 47. Left
external iliac node measures 2.6 x 1.4 cm on image 70.

Reproductive: Uterus is unremarkable. Right adnexal cystic lesion
measuring 5.6 cm.

Other: Small umbilical fat containing hernia.

Musculoskeletal: No acute osseous abnormality.
IMPRESSION: Pelvic retroperitoneal and inguinal adenopathy. Mild splenomegaly.
Together with findings on prior imaging, lymphoma is suspected.

## 2022-07-02 ENCOUNTER — Encounter (HOSPITAL_BASED_OUTPATIENT_CLINIC_OR_DEPARTMENT_OTHER): Payer: Self-pay | Admitting: Emergency Medicine

## 2022-07-02 ENCOUNTER — Emergency Department (HOSPITAL_BASED_OUTPATIENT_CLINIC_OR_DEPARTMENT_OTHER)
Admission: EM | Admit: 2022-07-02 | Discharge: 2022-07-02 | Disposition: A | Discharge status: Home / Self Care | Payer: Medicaid Other | Attending: Emergency Medicine | Admitting: Emergency Medicine

## 2022-07-02 DIAGNOSIS — N760 Acute vaginitis: Secondary | ICD-10-CM | POA: Insufficient documentation

## 2022-07-02 DIAGNOSIS — B9689 Other specified bacterial agents as the cause of diseases classified elsewhere: Secondary | ICD-10-CM

## 2022-07-02 DIAGNOSIS — A6920 Lyme disease, unspecified: Secondary | ICD-10-CM

## 2022-07-02 DIAGNOSIS — N3000 Acute cystitis without hematuria: Secondary | ICD-10-CM | POA: Insufficient documentation

## 2022-07-02 DIAGNOSIS — N898 Other specified noninflammatory disorders of vagina: Secondary | ICD-10-CM | POA: Diagnosis present

## 2022-07-02 DIAGNOSIS — M069 Rheumatoid arthritis, unspecified: Secondary | ICD-10-CM

## 2022-07-02 DIAGNOSIS — Z7952 Long term (current) use of systemic steroids: Secondary | ICD-10-CM | POA: Diagnosis not present

## 2022-07-02 LAB — URINALYSIS, ROUTINE W REFLEX MICROSCOPIC
Glucose, UA: NEGATIVE mg/dL
Ketones, ur: 15 mg/dL — AB
Nitrite: NEGATIVE
Protein, ur: 100 mg/dL — AB
Specific Gravity, Urine: 1.025 (ref 1.005–1.030)
pH: 6 (ref 5.0–8.0)

## 2022-07-02 LAB — URINALYSIS, MICROSCOPIC (REFLEX): WBC, UA: >50 WBC/hpf (ref 0–5)

## 2022-07-02 LAB — WET PREP, GENITAL
Sperm: NONE SEEN
Trich, Wet Prep: NONE SEEN
WBC, Wet Prep HPF POC: >=10 — AB (ref <10)
Yeast Wet Prep HPF POC: NONE SEEN

## 2022-07-02 LAB — PREGNANCY, URINE: Preg Test, Ur: NEGATIVE

## 2022-07-02 MED ORDER — PHENAZOPYRIDINE HCL 200 MG PO TABS: 200.0000 mg | ORAL_TABLET | Freq: Three times a day (TID) | ORAL | 0 refills | Status: DC

## 2022-07-02 MED ORDER — PHENAZOPYRIDINE HCL 100 MG PO TABS
200.0000 mg | ORAL_TABLET | Freq: Once | ORAL | Status: AC
  Administered 2022-07-02: 200 mg via ORAL
  Filled 2022-07-02: qty 2

## 2022-07-02 MED ORDER — METRONIDAZOLE 500 MG PO TABS
500.0000 mg | ORAL_TABLET | Freq: Once | ORAL | Status: AC
  Administered 2022-07-02: 500 mg via ORAL
  Filled 2022-07-02: qty 1

## 2022-07-02 MED ORDER — NITROFURANTOIN MONOHYD MACRO 100 MG PO CAPS: 100.0000 mg | ORAL_CAPSULE | Freq: Two times a day (BID) | ORAL | 0 refills | Status: DC

## 2022-07-02 MED ORDER — NITROFURANTOIN MONOHYD MACRO 100 MG PO CAPS
100.0000 mg | ORAL_CAPSULE | Freq: Once | ORAL | Status: AC
  Administered 2022-07-02: 100 mg via ORAL
  Filled 2022-07-02: qty 1

## 2022-07-02 MED ORDER — METRONIDAZOLE 500 MG PO TABS: 500.0000 mg | ORAL_TABLET | Freq: Two times a day (BID) | ORAL | 0 refills | Status: DC

## 2022-07-02 NOTE — ED Triage Notes (Signed)
Lower right. Abd pain and abnormal vaginal discharge started today.

## 2022-07-02 NOTE — ED Provider Notes (Signed)
[facility] EMERGENCY DEPARTMENT Provider Note   CSN: [id number] Arrival date & time: 07/02/22  0241     History  Chief Complaint  Patient presents with   Abdominal Pain   Vaginal Discharge    Katie Middleton is a 36 y.o. female.  The history is provided by the patient.  Abdominal Pain Pain location: low abdomen right of center. Pain quality comment:  Burning with urination Pain radiates to:  Does not radiate Onset quality:  Gradual Duration: Started today. Timing:  Constant Progression:  Unchanged Chronicity:  New Relieved by:  Nothing Worsened by:  Nothing Ineffective treatments:  None tried Associated symptoms: vaginal discharge   Associated symptoms comment:  Dysuria Risk factors: not pregnant   Vaginal Discharge Quality:  White Associated symptoms: abdominal pain        Home Medications Prior to Admission medications   Medication Sig Start Date End Date Taking? Authorizing Provider  metroNIDAZOLE (FLAGYL) 500 MG tablet Take 1 tablet (500 mg total) by mouth 2 (two) times daily. 07/02/22  Yes Kristy Schomburg, MD  nitrofurantoin, macrocrystal-monohydrate, (MACROBID) 100 MG capsule Take 1 capsule (100 mg total) by mouth 2 (two) times daily. X 7 days 07/02/22  Yes Sailor Haughn, MD  phenazopyridine (PYRIDIUM) 200 MG tablet Take 1 tablet (200 mg total) by mouth 3 (three) times daily. 07/02/22  Yes Modine Oppenheimer, MD  diclofenac Sodium (VOLTAREN) 1 % GEL Apply 2 g topically 4 (four) times daily. 07/21/20   Joseph Art, DO  predniSONE (DELTASONE) 10 MG tablet     [provider]  predniSONE (DELTASONE) 5 MG tablet Take 15 mg/day x 1 week, then 10 mg/day x 1 week, then 7.5 mg/day x 1 week, then 5 mg daily. 08/31/20   [provider]  vitamin B-12 (CYANOCOBALAMIN) 1000 MCG tablet Take 1 tablet (1,000 mcg total) by mouth daily. 07/21/20   Joseph Art, DO      Allergies    Bactrim [sulfamethoxazole-trimethoprim], Penicillins, Amoxicillin,  and Bactroban [mupirocin calcium]    Review of Systems   Review of Systems  Gastrointestinal:  Positive for abdominal pain.  Genitourinary:  Positive for vaginal discharge.    Physical Exam Updated Vital Signs BP 122/84 (BP Location: Right Arm)   Pulse 69   Temp 98.3 F (36.8 C) (Oral)   Resp 18   Ht 5\' 3"  (1.6 m)   Wt 61.7 kg   LMP 05/26/2022 (Approximate)   SpO2 100%   BMI 24.09 kg/m  Physical Exam Vitals and nursing note reviewed.  Constitutional:      General: She is not in acute distress.    Appearance: Normal appearance. She is well-developed.  HENT:     Head: Normocephalic and atraumatic.     Nose: Nose normal.  Eyes:     Pupils: Pupils are equal, round, and reactive to light.  Cardiovascular:     Rate and Rhythm: Normal rate and regular rhythm.     Pulses: Normal pulses.     Heart sounds: Normal heart sounds.  Pulmonary:     Effort: Pulmonary effort is normal. No respiratory distress.     Breath sounds: Normal breath sounds.  Abdominal:     General: Abdomen is flat. Bowel sounds are normal. There is no distension.     Palpations: Abdomen is soft. There is no mass.     Tenderness: There is no abdominal tenderness. There is no guarding or rebound.     Hernia: No hernia is present.  Genitourinary:  Vagina: No vaginal discharge.  Musculoskeletal:        General: Normal range of motion.     Cervical back: Normal range of motion and neck supple.  Skin:    General: Skin is warm and dry.     Capillary Refill: Capillary refill takes less than 2 seconds.     Findings: No erythema or rash.  Neurological:     General: No focal deficit present.     Mental Status: She is alert and oriented to person, place, and time.     Deep Tendon Reflexes: Reflexes normal.  Psychiatric:        Thought Content: Thought content normal.     ED Results / Procedures / Treatments   Labs (all labs ordered are listed, but only abnormal results are displayed) Labs Reviewed  WET  PREP, GENITAL - Abnormal; Notable for the following components:      Result Value   Clue Cells Wet Prep HPF POC PRESENT (*)    WBC, Wet Prep HPF POC >=10 (*)    All other components within normal limits  URINALYSIS, ROUTINE W REFLEX MICROSCOPIC - Abnormal; Notable for the following components:   APPearance TURBID (*)    Hgb urine dipstick SMALL (*)    Bilirubin Urine SMALL (*)    Ketones, ur 15 (*)    Protein, ur 100 (*)    Leukocytes,Ua LARGE (*)    All other components within normal limits  URINALYSIS, MICROSCOPIC (REFLEX) - Abnormal; Notable for the following components:   Bacteria, UA MANY (*)    Non Squamous Epithelial PRESENT (*)    All other components within normal limits  PREGNANCY, URINE  GC/CHLAMYDIA PROBE AMP ([place]) NOT AT [facility]    EKG None  Radiology No results found.  Procedures Procedures    Medications Ordered in ED Medications  nitrofurantoin (macrocrystal-monohydrate) (MACROBID) capsule 100 mg (100 mg Oral Given 07/02/22 0444)  metroNIDAZOLE (FLAGYL) tablet 500 mg (500 mg Oral Given 07/02/22 0444)  phenazopyridine (PYRIDIUM) tablet 200 mg (200 mg Oral Given 07/02/22 0444)    ED Course/ Medical Decision Making/ A&P                           Medical Decision Making Patient with dysuria and white vaginal discharge that started today.    Amount and/or Complexity of Data Reviewed External Data Reviewed: notes.    Details: Previous notes reviewed  Labs: ordered.    Details: All labs reviewed: Negative pregnancy test.  Wet prep is consistent with bacterial vaginosis.  Urinalysis is consistent with UTI    Risk Prescription drug management. Risk Details: Well appearing, normal exam and vitals.  Given patient's allergies.  Will start macrobid for UTI, flagyl for bacterial vaginosis and pyridium for pain associated with UTI.  Stable for discharge.  Strict return.      Final Clinical Impression(s) / ED Diagnoses Final diagnoses:  Bacterial  vaginosis  Acute cystitis without hematuria   Return for intractable cough, coughing up blood, fevers > 100.4 unrelieved by medication, shortness of breath, intractable vomiting, chest pain, shortness of breath, weakness, numbness, changes in speech, facial asymmetry, abdominal pain, passing out, Inability to tolerate liquids or food, cough, altered mental status or any concerns. No signs of systemic illness or infection. The patient is nontoxic-appearing on exam and vital signs are within normal limits.  I have reviewed the triage vital signs and the nursing notes. Pertinent labs & imaging results  that were available during my care of the patient were reviewed by me and considered in my medical decision making (see chart for details). After history, exam, and medical workup I feel the patient has been appropriately medically screened and is safe for discharge home. Pertinent diagnoses were discussed with the patient. Patient was given return precautions.  Rx / DC Orders ED Discharge Orders          Ordered    metroNIDAZOLE (FLAGYL) 500 MG tablet  2 times daily        07/02/22 0435    phenazopyridine (PYRIDIUM) 200 MG tablet  3 times daily        07/02/22 0435    nitrofurantoin, macrocrystal-monohydrate, (MACROBID) 100 MG capsule  2 times daily        07/02/22 0435              Shaynna Husby, MD 07/02/22 4034

## 2022-07-03 LAB — GC/CHLAMYDIA PROBE AMP (~~LOC~~) NOT AT ARMC
Chlamydia: NEGATIVE
Comment: NEGATIVE
Comment: NORMAL
Neisseria Gonorrhea: NEGATIVE

## 2023-07-02 ENCOUNTER — Emergency Department (HOSPITAL_BASED_OUTPATIENT_CLINIC_OR_DEPARTMENT_OTHER)
Admission: EM | Admit: 2023-07-02 | Discharge: 2023-07-02 | Disposition: A | Discharge status: Home / Self Care | Payer: Medicaid Other | Attending: Emergency Medicine | Admitting: Emergency Medicine

## 2023-07-02 ENCOUNTER — Encounter (HOSPITAL_BASED_OUTPATIENT_CLINIC_OR_DEPARTMENT_OTHER): Payer: Self-pay

## 2023-07-02 DIAGNOSIS — M25562 Pain in left knee: Secondary | ICD-10-CM | POA: Insufficient documentation

## 2023-07-02 DIAGNOSIS — M79673 Pain in unspecified foot: Secondary | ICD-10-CM | POA: Insufficient documentation

## 2023-07-02 DIAGNOSIS — M25511 Pain in right shoulder: Secondary | ICD-10-CM | POA: Insufficient documentation

## 2023-07-02 DIAGNOSIS — M25512 Pain in left shoulder: Secondary | ICD-10-CM | POA: Insufficient documentation

## 2023-07-02 DIAGNOSIS — M2550 Pain in unspecified joint: Secondary | ICD-10-CM

## 2023-07-02 DIAGNOSIS — M25561 Pain in right knee: Secondary | ICD-10-CM | POA: Insufficient documentation

## 2023-07-02 HISTORY — DX: Systemic lupus erythematosus, unspecified: M32.9

## 2023-07-02 MED ORDER — PREDNISONE 10 MG (21) PO TBPK: ORAL_TABLET | Freq: Every day | ORAL | 0 refills | Status: DC

## 2023-07-02 NOTE — ED Triage Notes (Signed)
Pt reports hx of Lupus joint pain and swelling x 2 weeks. Flared up due to cold weather. Unable to go to rheumatologist due to no insurance

## 2023-07-02 NOTE — ED Provider Notes (Signed)
[place] EMERGENCY DEPARTMENT AT [facility] Provider Note   CSN: [id number] Arrival date & time: 07/02/23  1025     History  Chief Complaint  Patient presents with   Joint Pain    Katie Middleton is a 37 y.o. female.  37 year old female with history of lupus presents with flare onset about 1 week ago with pain in her joints including fingers (PIP), shoulders, knees, feet. Shoulder pain is new otherwise symptoms consistent with prior. Unable to see her rheumatologist due to insurance problems. Typically managed with prednisone taper and requesting same. Has not taken Prednisone in a long time.         Home Medications Prior to Admission medications   Medication Sig Start Date End Date Taking? Authorizing Provider  predniSONE (STERAPRED UNI-PAK 21 TAB) 10 MG (21) TBPK tablet Take by mouth daily. Take 6 tabs by mouth daily  for 2 days, then 5 tabs for 2 days, then 4 tabs for 2 days, then 3 tabs for 2 days, 2 tabs for 2 days, then 1 tab by mouth daily for 2 days 07/02/23  Yes Army Melia A, PA-C  diclofenac Sodium (VOLTAREN) 1 % GEL Apply 2 g topically 4 (four) times daily. 07/21/20   Joseph Art, DO  metroNIDAZOLE (FLAGYL) 500 MG tablet Take 1 tablet (500 mg total) by mouth 2 (two) times daily. 07/02/22   Palumbo, April, MD  nitrofurantoin, macrocrystal-monohydrate, (MACROBID) 100 MG capsule Take 1 capsule (100 mg total) by mouth 2 (two) times daily. X 7 days 07/02/22   Palumbo, April, MD  phenazopyridine (PYRIDIUM) 200 MG tablet Take 1 tablet (200 mg total) by mouth 3 (three) times daily. 07/02/22   Palumbo, April, MD  vitamin B-12 (CYANOCOBALAMIN) 1000 MCG tablet Take 1 tablet (1,000 mcg total) by mouth daily. 07/21/20   Joseph Art, DO      Allergies    Bactrim [sulfamethoxazole-trimethoprim], Penicillins, Amoxicillin, and Bactroban [mupirocin calcium]    Review of Systems   Review of Systems Negative except as per HPI Physical Exam Updated Vital  Signs BP 110/65 (BP Location: Right Arm)   Pulse 82   Temp (!) 97 F (36.1 C)   Resp 17   Wt 61.2 kg   LMP 05/27/2023   SpO2 98%   BMI 23.91 kg/m  Physical Exam Vitals and nursing note reviewed.  Constitutional:      General: She is not in acute distress.    Appearance: She is well-developed. She is not diaphoretic.  HENT:     Head: Normocephalic and atraumatic.  Cardiovascular:     Rate and Rhythm: Normal rate and regular rhythm.     Heart sounds: Normal heart sounds.  Pulmonary:     Effort: Pulmonary effort is normal.     Breath sounds: Normal breath sounds.  Musculoskeletal:        General: No swelling, tenderness, deformity or signs of injury.     Cervical back: Neck supple.     Right lower leg: No edema.     Left lower leg: No edema.  Skin:    General: Skin is warm and dry.     Findings: No erythema or rash.  Neurological:     Mental Status: She is alert and oriented to person, place, and time.     Sensory: No sensory deficit.  Psychiatric:        Behavior: Behavior normal.     ED Results / Procedures / Treatments   Labs (all  labs ordered are listed, but only abnormal results are displayed) Labs Reviewed - No data to display  EKG None  Radiology No results found.  Procedures Procedures    Medications Ordered in ED Medications - No data to display  ED Course/ Medical Decision Making/ A&P                                 Medical Decision Making Risk Prescription drug management.   38 year old female with history of lupus presents with complaint of polyarthralgia as above.  Denies any rashes or overlying skin changes or acute joint swelling.  Patient has had a change in insurance coverage and is unable to see her rheumatologist.  Is here requesting prednisone taper.  Exam is unremarkable, does have mild swelling of the PIP joints of the fingers otherwise no other joint swelling noted.  Patient is well-appearing, nontoxic and in no distress.  Plan is  to refill her prednisone.  Unable to provide additional refills and encouraged        Final Clinical Impression(s) / ED Diagnoses Final diagnoses:  Polyarthralgia    Rx / DC Orders ED Discharge Orders          Ordered    predniSONE (STERAPRED UNI-PAK 21 TAB) 10 MG (21) TBPK tablet  Daily        07/02/23 1154              Jeannie Fend, PA-C 07/02/23 1638    Tegeler, Canary Brim, MD 07/03/23 [phone]

## 2023-07-02 NOTE — Discharge Instructions (Signed)
Take prednisone as prescribed and follow up with your rheumatologist or primary care as soon as possible.

## 2023-09-19 ENCOUNTER — Encounter (HOSPITAL_BASED_OUTPATIENT_CLINIC_OR_DEPARTMENT_OTHER): Payer: Self-pay | Admitting: *Deleted

## 2023-09-19 ENCOUNTER — Other Ambulatory Visit: Payer: Self-pay

## 2023-09-19 ENCOUNTER — Emergency Department (HOSPITAL_BASED_OUTPATIENT_CLINIC_OR_DEPARTMENT_OTHER)
Admission: EM | Admit: 2023-09-19 | Discharge: 2023-09-19 | Disposition: A | Discharge status: Home / Self Care | Payer: Managed Care, Other (non HMO) | Attending: Emergency Medicine | Admitting: Emergency Medicine

## 2023-09-19 DIAGNOSIS — L509 Urticaria, unspecified: Secondary | ICD-10-CM | POA: Insufficient documentation

## 2023-09-19 DIAGNOSIS — T7840XA Allergy, unspecified, initial encounter: Secondary | ICD-10-CM | POA: Insufficient documentation

## 2023-09-19 DIAGNOSIS — R21 Rash and other nonspecific skin eruption: Secondary | ICD-10-CM | POA: Diagnosis present

## 2023-09-19 MED ORDER — DIPHENHYDRAMINE HCL 25 MG PO CAPS
25.0000 mg | ORAL_CAPSULE | Freq: Once | ORAL | Status: AC
  Administered 2023-09-19: 25 mg via ORAL
  Filled 2023-09-19: qty 1

## 2023-09-19 MED ORDER — PREDNISONE 50 MG PO TABS
60.0000 mg | ORAL_TABLET | Freq: Once | ORAL | Status: AC
  Administered 2023-09-19: 60 mg via ORAL
  Filled 2023-09-19: qty 1

## 2023-09-19 MED ORDER — PREDNISONE 10 MG (21) PO TBPK: ORAL_TABLET | Freq: Every day | ORAL | 0 refills | Status: DC

## 2023-09-19 MED ORDER — FAMOTIDINE 20 MG PO TABS
20.0000 mg | ORAL_TABLET | Freq: Once | ORAL | Status: AC
  Administered 2023-09-19: 20 mg via ORAL
  Filled 2023-09-19: qty 1

## 2023-09-19 MED ORDER — EPINEPHRINE 0.3 MG/0.3ML IJ SOAJ: 0.3000 mg | INTRAMUSCULAR | 0 refills | Status: AC | PRN

## 2023-09-19 MED ORDER — FAMOTIDINE 20 MG PO TABS: 20.0000 mg | ORAL_TABLET | Freq: Every day | ORAL | 0 refills | Status: AC

## 2023-09-19 NOTE — ED Triage Notes (Addendum)
 Pt c/o lip swelling and rash to hands that started after arriving to work tonight. Denies any new detergents or soaps prior to going to work. Denies sob. Pt was given benadryl around 2330

## 2023-09-19 NOTE — ED Provider Notes (Signed)
 [place] EMERGENCY DEPARTMENT AT [facility] Provider Note  CSN: [id number] Arrival date & time: 09/19/23 0341  Chief Complaint(s) Allergic Reaction  HPI Katie Middleton is a 38 y.o. female     Allergic Reaction Presenting symptoms: itching, rash and swelling   Presenting symptoms: no difficulty breathing and no difficulty swallowing   Severity:  Moderate Duration:  8 hours Prior allergic episodes:  No prior episodes Context: not animal exposure, not chemicals, not cosmetics, not dairy/milk products, not eggs, not food allergies, not grass, not insect bite/sting, not jewelry/metal, not medications, not new detergents/soaps, not nuts and not poison ivy   Relieved by:  Nothing Worsened by:  Nothing Ineffective treatments:  Antihistamines   Past Medical History Past Medical History:  Diagnosis Date   Anemia    Lupus (systemic lupus erythematosus) (HCC)    Patient Active Problem List   Diagnosis Date Noted   Lyme disease 07/02/2022   Rheumatoid arthritis (HCC) 07/02/2022   Abnormal CT of the chest 07/17/2020   Anemia 07/17/2020   Symptomatic anemia 11/12/2018   Iron deficiency anemia 11/12/2018   Abdominal pain with vomiting 11/12/2018   Fluid in endometrial cavity 11/12/2018   Home Medication(s) Prior to Admission medications   Medication Sig Start Date End Date Taking? Authorizing Provider  EPINEPHrine  0.3 mg/0.3 mL IJ SOAJ injection Inject 0.3 mg into the muscle as needed for anaphylaxis. 09/19/23  Yes Leasia Swann, Raynell Moder, MD  famotidine  (PEPCID ) 20 MG tablet Take 1 tablet (20 mg total) by mouth daily for 5 days. 09/19/23 09/24/23 Yes Francheska Villeda, Raynell Moder, MD  predniSONE  (STERAPRED UNI-PAK 21 TAB) 10 MG (21) TBPK tablet Take by mouth daily. Take 6 tabs by mouth daily  for 2 days, then 5 tabs for 2 days, then 4 tabs for 2 days, then 3 tabs for 2 days, 2 tabs for 2 days, then 1 tab by mouth daily for 2 days 09/19/23  Yes Corwyn Vora, Raynell Moder, MD  diclofenac  Sodium  (VOLTAREN ) 1 % GEL Apply 2 g topically 4 (four) times daily. 07/21/20   Vann, Jessica U, DO  metroNIDAZOLE  (FLAGYL ) 500 MG tablet Take 1 tablet (500 mg total) by mouth 2 (two) times daily. 07/02/22   Palumbo, April, MD  nitrofurantoin , macrocrystal-monohydrate, (MACROBID ) 100 MG capsule Take 1 capsule (100 mg total) by mouth 2 (two) times daily. X 7 days 07/02/22   Palumbo, April, MD  phenazopyridine  (PYRIDIUM ) 200 MG tablet Take 1 tablet (200 mg total) by mouth 3 (three) times daily. 07/02/22   Palumbo, April, MD  vitamin B-12 (CYANOCOBALAMIN) 1000 MCG tablet Take 1 tablet (1,000 mcg total) by mouth daily. 07/21/20   Vann, Jessica U, DO                                                                                                                                    Allergies Bactrim  [sulfamethoxazole -trimethoprim ], Penicillins, Amoxicillin, and Bactroban [mupirocin calcium]  Review of Systems  Review of Systems  HENT:  Negative for trouble swallowing.   Skin:  Positive for itching and rash.   As noted in HPI  Physical Exam Vital Signs  I have reviewed the triage vital signs BP (!) 109/55   Pulse 91   Temp 98.2 F (36.8 C)   Resp 16   LMP 09/17/2023   SpO2 97%   Physical Exam Vitals reviewed.  Constitutional:      General: She is not in acute distress.    Appearance: She is well-developed. She is not diaphoretic.  HENT:     Head: Normocephalic and atraumatic.     Nose: Nose normal.     Mouth/Throat:     Mouth: No angioedema.     Pharynx: No uvula swelling.     Comments: Periorbital and lower lip edema Eyes:     General: No scleral icterus.       Right eye: No discharge.        Left eye: No discharge.     Conjunctiva/sclera: Conjunctivae normal.     Right eye: Right conjunctiva is not injected.     Left eye: Left conjunctiva is not injected.     Pupils: Pupils are equal, round, and reactive to light.  Cardiovascular:     Rate and Rhythm: Normal rate and regular rhythm.      Heart sounds: No murmur heard.    No friction rub. No gallop.  Pulmonary:     Effort: Pulmonary effort is normal. No respiratory distress.     Breath sounds: Normal breath sounds. No stridor. No wheezing or rales.  Abdominal:     General: There is no distension.     Palpations: Abdomen is soft.     Tenderness: There is no abdominal tenderness.  Musculoskeletal:        General: No tenderness.     Cervical back: Normal range of motion and neck supple.  Skin:    General: Skin is warm and dry.     Findings: Rash present. No erythema. Rash is urticarial.  Neurological:     Mental Status: She is alert and oriented to person, place, and time.     ED Results and Treatments Labs (all labs ordered are listed, but only abnormal results are displayed) Labs Reviewed - No data to display                                                                                                                       EKG  EKG Interpretation Date/Time:    Ventricular Rate:    PR Interval:    QRS Duration:    QT Interval:    QTC Calculation:   R Axis:      Text Interpretation:         Radiology No results found.  Medications Ordered in ED Medications  diphenhydrAMINE  (BENADRYL ) capsule 25 mg (25 mg Oral Given 09/19/23 0420)  predniSONE  (DELTASONE ) tablet 60 mg (60 mg Oral Given 09/19/23 0423)  famotidine  (  PEPCID ) tablet 20 mg (20 mg Oral Given 09/19/23 0420)   Procedures Procedures  (including critical care time) Medical Decision Making / ED Course   Medical Decision Making   38 y.o. female here with pruritic rash. No known triggers or exposures. No respiratory, GI, or neurologic symptoms to suggest anaphylaxis. No recent infectious symptoms other than rhinorrhea suggestive of viral urticaria.  Patient has taken benadryl  prior to arrival.   On exam, there is no evidence of oral swelling or airway compromise.   Given Benadryl , H2 blocker, and steroids. Monitored for 2 hrs. Symptoms  improved.  Safe for discharge with strict return precautions.   Final Clinical Impression(s) / ED Diagnoses Final diagnoses:  Allergic reaction, initial encounter   The patient appears reasonably screened and/or stabilized for discharge and I doubt any other medical condition or other [facility] requiring further screening, evaluation, or treatment in the ED at this time. I have discussed the findings, Dx and Tx plan with the patient/family who expressed understanding and agree(s) with the plan. Discharge instructions discussed at length. The patient/family was given strict return precautions who verbalized understanding of the instructions. No further questions at time of discharge.  Disposition: Discharge  Condition: Good  ED Discharge Orders          Ordered    predniSONE  (STERAPRED UNI-PAK 21 TAB) 10 MG (21) TBPK tablet  Daily        09/19/23 0555    famotidine  (PEPCID ) 20 MG tablet  Daily        09/19/23 0555    EPINEPHrine  0.3 mg/0.3 mL IJ SOAJ injection  As needed        09/19/23 0555              Follow Up: Primary care provider  Schedule an appointment as soon as possible for a visit      This chart was dictated using voice recognition software.  Despite best efforts to proofread,  errors can occur which can change the documentation meaning.    Trine Raynell Moder, MD 09/19/23 [phone]

## 2023-12-12 ENCOUNTER — Emergency Department (HOSPITAL_BASED_OUTPATIENT_CLINIC_OR_DEPARTMENT_OTHER)
Admission: EM | Admit: 2023-12-12 | Discharge: 2023-12-12 | Disposition: A | Discharge status: Home / Self Care | Payer: Self-pay | Attending: Emergency Medicine | Admitting: Emergency Medicine

## 2023-12-12 ENCOUNTER — Other Ambulatory Visit: Payer: Self-pay

## 2023-12-12 DIAGNOSIS — M2550 Pain in unspecified joint: Secondary | ICD-10-CM | POA: Insufficient documentation

## 2023-12-12 MED ORDER — PREDNISONE 10 MG (21) PO TBPK: ORAL_TABLET | Freq: Every day | ORAL | 0 refills | Status: DC

## 2023-12-12 NOTE — ED Triage Notes (Signed)
 Pt states "I need a prescription for prednisone, my lupus is flaring up", reports joint pain

## 2023-12-12 NOTE — ED Notes (Signed)
 Pt aggravated and verbally aggressive toward staff during triage. Refused temporal scanner for taking temperature. Did agree to oral temp, but only reluctantly.

## 2023-12-12 NOTE — ED Provider Notes (Addendum)
 [place] [facility] Provider Note   CSN: [id number] Arrival date & time: 12/12/23  1845     History  Chief Complaint  Patient presents with   Joint Pain    Katie Middleton is a 38 y.o. female with history of lupus and anemia presents with lupus flare.  She describes generalized aches that are in her typical locations when she experiences a lupus flare.  She is requesting a steroid taper which typically alleviates her symptoms.  She is still waiting for appointment in October to see her rheumatologist.  Currently does not have a primary care doctor.  HPI    Past Medical History:  Diagnosis Date   Anemia    Lupus (systemic lupus erythematosus) (HCC)      Home Medications Prior to Admission medications   Medication Sig Start Date End Date Taking? Authorizing Provider  predniSONE (STERAPRED UNI-PAK 21 TAB) 10 MG (21) TBPK tablet Take by mouth daily. Take 6 tabs by mouth daily  for 2 days, then 5 tabs for 2 days, then 4 tabs for 2 days, then 3 tabs for 2 days, 2 tabs for 2 days, then 1 tab by mouth daily for 2 days 12/12/23  Yes Halford Decamp, PA-C  diclofenac Sodium (VOLTAREN) 1 % GEL Apply 2 g topically 4 (four) times daily. 07/21/20   Joseph Art, DO  EPINEPHrine 0.3 mg/0.3 mL IJ SOAJ injection Inject 0.3 mg into the muscle as needed for anaphylaxis. 09/19/23   Nira Conn, MD  famotidine (PEPCID) 20 MG tablet Take 1 tablet (20 mg total) by mouth daily for 5 days. 09/19/23 09/24/23  Nira Conn, MD  metroNIDAZOLE (FLAGYL) 500 MG tablet Take 1 tablet (500 mg total) by mouth 2 (two) times daily. 07/02/22   Palumbo, April, MD  nitrofurantoin, macrocrystal-monohydrate, (MACROBID) 100 MG capsule Take 1 capsule (100 mg total) by mouth 2 (two) times daily. X 7 days 07/02/22   Palumbo, April, MD  phenazopyridine (PYRIDIUM) 200 MG tablet Take 1 tablet (200 mg total) by mouth 3 (three) times daily. 07/02/22   Palumbo, April, MD   predniSONE (STERAPRED UNI-PAK 21 TAB) 10 MG (21) TBPK tablet Take by mouth daily. Take 6 tabs by mouth daily  for 2 days, then 5 tabs for 2 days, then 4 tabs for 2 days, then 3 tabs for 2 days, 2 tabs for 2 days, then 1 tab by mouth daily for 2 days 09/19/23   Cardama, Amadeo Garnet, MD  vitamin B-12 (CYANOCOBALAMIN) 1000 MCG tablet Take 1 tablet (1,000 mcg total) by mouth daily. 07/21/20   Joseph Art, DO      Allergies    Bactrim [sulfamethoxazole-trimethoprim], Penicillins, Amoxicillin, and Bactroban [mupirocin calcium]    Review of Systems   Review of Systems  Musculoskeletal:  Positive for arthralgias and myalgias.    Physical Exam Updated Vital Signs BP 113/64 (BP Location: Left Arm)   Pulse 77   Temp 98 F (36.7 C) (Oral)   Resp 18   Ht 5\' 3"  (1.6 m)   Wt 61.2 kg   LMP  (LMP Unknown)   SpO2 100%   BMI 23.91 kg/m  Physical Exam Vitals and nursing note reviewed.  Constitutional:      General: She is not in acute distress.    Appearance: She is well-developed.  HENT:     Head: Normocephalic and atraumatic.  Eyes:     Conjunctiva/sclera: Conjunctivae normal.  Cardiovascular:     Rate  and Rhythm: Normal rate and regular rhythm.     Heart sounds: No murmur heard. Pulmonary:     Effort: Pulmonary effort is normal. No respiratory distress.     Breath sounds: Normal breath sounds.  Abdominal:     Palpations: Abdomen is soft.     Tenderness: There is no abdominal tenderness.  Musculoskeletal:        General: No swelling.     Cervical back: Neck supple.     Comments: Tolerates full range of motion of extremities, no specific point of greater tenderness than the others, ambulates without difficulty  Skin:    General: Skin is warm and dry.     Capillary Refill: Capillary refill takes less than 2 seconds.  Neurological:     Mental Status: She is alert.  Psychiatric:        Mood and Affect: Mood normal.     ED Results / Procedures / Treatments   Labs (all labs  ordered are listed, but only abnormal results are displayed) Labs Reviewed - No data to display  EKG None  Radiology No results found.  Procedures Procedures    Medications Ordered in ED Medications - No data to display  ED Course/ Medical Decision Making/ A&P                                 Medical Decision Making Risk Prescription drug management.   This patient presents to the ED with chief complaint(s) of lupus flare .  The complaint involves an extensive differential diagnosis and also carries with it a high risk of complications and morbidity.   pertinent past medical history as listed in HPI   Additional history obtained:  Records reviewed Care Everywhere/External Records  Initial Assessment:   Hemodynamically stable patient presenting with complaints of lupus flare.  Joint pain is in typical locations.  No differentiation from prior flares.  She is requesting steroid taper which typically alleviates her symptoms.  Denies any other symptoms then joint pain.  Has had multiple visits to the ED with similar complaints.  No joint with significant swelling, tenderness, erythema or warmth.  No suspicion for septic joint.  It has been sometime since her last taper.  Independent ECG interpretation:  none  Independent labs interpretation:  The following labs were independently interpreted:  none  Independent visualization and interpretation of imaging: none  Treatment and Reassessment: none  Consultations obtained:   none  Disposition:   Patient discharged home with prescription for steroid taper. The patient has been appropriately medically screened and/or stabilized in the ED. I have low suspicion for any other emergent medical condition which would require further screening, evaluation or treatment in the ED or require inpatient management. At time of discharge the patient is hemodynamically stable and in no acute distress. I have discussed work-up results and  diagnosis with patient and answered all questions. Patient is agreeable with discharge plan. We discussed strict return precautions for returning to the emergency department and they verbalized understanding.     Social Determinants of Health:   none  This note was dictated with voice recognition software.  Despite best efforts at proofreading, errors may have occurred which can change the documentation meaning.          Final Clinical Impression(s) / ED Diagnoses Final diagnoses:  Arthralgia, unspecified joint    Rx / DC Orders ED Discharge Orders  Ordered    predniSONE (STERAPRED UNI-PAK 21 TAB) 10 MG (21) TBPK tablet  Daily        12/12/23 2146              Halford Decamp, PA-C 12/12/23 2146    Halford Decamp, PA-C 12/12/23 2148    Vanetta Mulders, MD 12/12/23 2312

## 2023-12-12 NOTE — Discharge Instructions (Addendum)
 You were evaluated in the emergency room for lupus flare.  Prescription for steroid taper was sent to your pharmacy.  Please follow-up with your primary care doctor for further management.

## 2024-01-12 ENCOUNTER — Emergency Department (HOSPITAL_BASED_OUTPATIENT_CLINIC_OR_DEPARTMENT_OTHER)
Admission: EM | Admit: 2024-01-12 | Discharge: 2024-01-12 | Disposition: A | Discharge status: Home / Self Care | Payer: Self-pay | Attending: Emergency Medicine | Admitting: Emergency Medicine

## 2024-01-12 ENCOUNTER — Encounter (HOSPITAL_BASED_OUTPATIENT_CLINIC_OR_DEPARTMENT_OTHER): Payer: Self-pay | Admitting: Emergency Medicine

## 2024-01-12 ENCOUNTER — Other Ambulatory Visit: Payer: Self-pay

## 2024-01-12 DIAGNOSIS — M2550 Pain in unspecified joint: Secondary | ICD-10-CM | POA: Insufficient documentation

## 2024-01-12 DIAGNOSIS — M329 Systemic lupus erythematosus, unspecified: Secondary | ICD-10-CM | POA: Insufficient documentation

## 2024-01-12 MED ORDER — PREDNISONE 10 MG (21) PO TBPK
ORAL_TABLET | Freq: Every day | ORAL | 0 refills | Status: DC
Start: 2024-01-12 — End: 2024-02-11

## 2024-01-12 NOTE — ED Provider Notes (Signed)
 [place] [facility] Provider Note   CSN: [id number] Arrival date & time: 01/12/24  1817     History  Chief Complaint  Patient presents with   Joint Pain    Katie Middleton is a 38 y.o. female with a history of lupus and anemia who presents the ED today for arthralgias.  Patient reports that she lost her insurance and cannot afford it through her work so is not able to establish with a primary care or rheumatologist.  States that she is having persistent joint pain over the past several weeks.  Is to be on Plaquenil but the prescription expired.  Has received steroid packs from the ED in the past which have helped with her symptoms.  Is requesting another one at this time.  No additional complaints or concerns.     Home Medications Prior to Admission medications   Medication Sig Start Date End Date Taking? Authorizing Provider  diclofenac  Sodium (VOLTAREN ) 1 % GEL Apply 2 g topically 4 (four) times daily. 07/21/20   Vann, Jessica U, DO  EPINEPHrine  0.3 mg/0.3 mL IJ SOAJ injection Inject 0.3 mg into the muscle as needed for anaphylaxis. 09/19/23   Cardama, Camila Cecil, MD  famotidine  (PEPCID ) 20 MG tablet Take 1 tablet (20 mg total) by mouth daily for 5 days. 09/19/23 09/24/23  Lindle Rhea, MD  metroNIDAZOLE  (FLAGYL ) 500 MG tablet Take 1 tablet (500 mg total) by mouth 2 (two) times daily. 07/02/22   Palumbo, April, MD  nitrofurantoin , macrocrystal-monohydrate, (MACROBID ) 100 MG capsule Take 1 capsule (100 mg total) by mouth 2 (two) times daily. X 7 days 07/02/22   Palumbo, April, MD  phenazopyridine  (PYRIDIUM ) 200 MG tablet Take 1 tablet (200 mg total) by mouth 3 (three) times daily. 07/02/22   Palumbo, April, MD  predniSONE  (STERAPRED UNI-PAK 21 TAB) 10 MG (21) TBPK tablet Take by mouth daily. Take 6 tabs by mouth daily  for 2 days, then 5 tabs for 2 days, then 4 tabs for 2 days, then 3 tabs for 2 days, 2 tabs for 2 days, then 1 tab by mouth daily  for 2 days 12/12/23   Felicie Horning, PA-C  predniSONE  (STERAPRED UNI-PAK 21 TAB) 10 MG (21) TBPK tablet Take by mouth daily. Take 6 tabs by mouth daily  for 2 days, then 5 tabs for 2 days, then 4 tabs for 2 days, then 3 tabs for 2 days, 2 tabs for 2 days, then 1 tab by mouth daily for 2 daysTake by mouth daily. 01/12/24   Sonnie Dusky, PA-C  vitamin B-12 (CYANOCOBALAMIN) 1000 MCG tablet Take 1 tablet (1,000 mcg total) by mouth daily. 07/21/20   Vann, Jessica U, DO      Allergies    Bactrim  [sulfamethoxazole -trimethoprim ], Penicillins, Amoxicillin, and Bactroban [mupirocin calcium]    Review of Systems   Review of Systems  Musculoskeletal:  Positive for arthralgias.  All other systems reviewed and are negative.   Physical Exam Updated Vital Signs BP 116/70   Pulse 75   Resp 16   LMP 11/26/2023 (Approximate)   SpO2 100%  Physical Exam Vitals and nursing note reviewed.  Constitutional:      General: She is not in acute distress.    Appearance: Normal appearance.  HENT:     Head: Normocephalic and atraumatic.     Mouth/Throat:     Mouth: Mucous membranes are moist.  Eyes:     Conjunctiva/sclera: Conjunctivae normal.     Pupils:  Pupils are equal, round, and reactive to light.  Cardiovascular:     Rate and Rhythm: Normal rate and regular rhythm.     Pulses: Normal pulses.     Heart sounds: Normal heart sounds.  Pulmonary:     Effort: Pulmonary effort is normal.     Breath sounds: Normal breath sounds.  Abdominal:     Palpations: Abdomen is soft.     Tenderness: There is no abdominal tenderness.  Musculoskeletal:        General: Normal range of motion.     Cervical back: Normal range of motion.  Skin:    General: Skin is warm and dry.     Findings: No rash.  Neurological:     General: No focal deficit present.     Mental Status: She is alert.  Psychiatric:        Mood and Affect: Mood normal.        Behavior: Behavior normal.    ED Results / Procedures /  Treatments   Labs (all labs ordered are listed, but only abnormal results are displayed) Labs Reviewed - No data to display  EKG None  Radiology No results found.  Procedures Procedures    Medications Ordered in ED Medications - No data to display  ED Course/ Medical Decision Making/ A&P                                 Medical Decision Making  This patient presents to the ED for concern of arthralgias, this involves an extensive number of treatment options, and is a complaint that carries with it a high risk of complications and morbidity.   Differential diagnosis includes: Arthritis, arthralgia secondary to lupus, viral illness, etc.   Comorbidities  See HPI above   Additional History  Additional history obtained from prior records   Problem List / ED Course / Critical Interventions / Medication Management  Patient reports that she has history of lupus and does not have insurance at the moment.  Does not To the primary care or rheumatologist.  States that she has been having arthralgias for the past several months and used to be on Plaquenil but has not is on anything.  Requesting a steroid pack.  Received one several months ago with some improvement of symptoms.  Denies any additional complaints or concerns. I have reviewed the patients home medicines and have made adjustments as needed   Social Determinants of Health  Access to healthcare   Test / Admission - Considered  Patient is hemodynamically stable and safe for discharge home. Return precautions given.       Final Clinical Impression(s) / ED Diagnoses Final diagnoses:  Systemic lupus erythematosus, unspecified SLE type, unspecified organ involvement status (HCC)  Arthralgia of multiple sites, bilateral    Rx / DC Orders ED Discharge Orders          Ordered    predniSONE  (STERAPRED UNI-PAK 21 TAB) 10 MG (21) TBPK tablet  Daily        01/12/24 2031              Sonnie Dusky,  PA-C 01/12/24 2044    Mordecai Applebaum, MD 01/13/24 1300

## 2024-01-12 NOTE — Discharge Instructions (Addendum)
 As discussed, take prednisone  taper pack as instructed on the package.  I have provided information for a local rheumatologist to follow-up with when when you are able to get insurance.  Get help right away if: You have chest pain. You have trouble breathing. You have a seizure. You suddenly get a very bad headache. You suddenly develop facial or body weakness. You cannot speak. You cannot understand speech.

## 2024-01-12 NOTE — ED Notes (Addendum)
 Patient appears upset with triage questions, answers " I don't know to most questions . And stated that I should look at the chart before asking her questions . Pt refuses temperature measure and stated " I want to be the Bitch now ". Took her blood pressure cuff and throw it on the floor beside the trash  on her way back to the lobby .

## 2024-01-12 NOTE — ED Notes (Signed)
 Pt refuses temperature taken .

## 2024-01-12 NOTE — ED Triage Notes (Addendum)
 Bilateral knee pain , shoulder , ankles , Hx Lupus . She adds she usually gets prednisone  for this pain .

## 2024-01-18 ENCOUNTER — Emergency Department (HOSPITAL_BASED_OUTPATIENT_CLINIC_OR_DEPARTMENT_OTHER)
Admission: EM | Admit: 2024-01-18 | Discharge: 2024-01-18 | Disposition: A | Discharge status: Home / Self Care | Payer: Self-pay | Attending: Emergency Medicine | Admitting: Emergency Medicine

## 2024-01-18 ENCOUNTER — Encounter (HOSPITAL_BASED_OUTPATIENT_CLINIC_OR_DEPARTMENT_OTHER): Payer: Self-pay

## 2024-01-18 ENCOUNTER — Emergency Department (HOSPITAL_BASED_OUTPATIENT_CLINIC_OR_DEPARTMENT_OTHER): Payer: Self-pay

## 2024-01-18 ENCOUNTER — Other Ambulatory Visit: Payer: Self-pay

## 2024-01-18 DIAGNOSIS — M79605 Pain in left leg: Secondary | ICD-10-CM | POA: Insufficient documentation

## 2024-01-18 DIAGNOSIS — M7989 Other specified soft tissue disorders: Secondary | ICD-10-CM

## 2024-01-18 DIAGNOSIS — M79604 Pain in right leg: Secondary | ICD-10-CM | POA: Insufficient documentation

## 2024-01-18 DIAGNOSIS — R2243 Localized swelling, mass and lump, lower limb, bilateral: Secondary | ICD-10-CM | POA: Insufficient documentation

## 2024-01-18 NOTE — ED Notes (Signed)
 [place] at bedside

## 2024-01-18 NOTE — ED Triage Notes (Signed)
 Pt is in a lupus flare right now. Was seen recently and has prednisone  at home that she is not taking regularly. Has pain in bilateral legs.

## 2024-01-18 NOTE — Discharge Instructions (Addendum)
 It was a pleasure caring for you today. Please follow up with your primary care provider. Seek emergency care if experiencing any new or worsening symptoms.

## 2024-01-18 NOTE — ED Provider Notes (Signed)
 [place] [facility] Provider Note   CSN: [id number] Arrival date & time: 01/18/24  1008     History  Chief Complaint  Patient presents with   Leg Pain    Gwendy Redhead is a 38 y.o. female with PMHx lupus, anemia who presents to ED concerned for BL LE swelling and pain along the great saphenous vein's BL. Patient works as a Engineer, civil (consulting) and noticed the pain last night. Patient then got home a took a shower and then noticed the BL LE swelling. She denies any trauma to her legs. Patient is specifically concerned for blood clots.  Patient with hx of lupus and is currently on Sterapred pack for diffuse myalgias which was prescribed last week.  She denies fever, chest pain, SOB/dyspnea, cough nausea, vomiting, diarrhea.   Leg Pain      Home Medications Prior to Admission medications   Medication Sig Start Date End Date Taking? Authorizing Provider  diclofenac  Sodium (VOLTAREN ) 1 % GEL Apply 2 g topically 4 (four) times daily. 07/21/20   Vann, Jessica U, DO  EPINEPHrine  0.3 mg/0.3 mL IJ SOAJ injection Inject 0.3 mg into the muscle as needed for anaphylaxis. 09/19/23   Lindle Rhea, MD  famotidine  (PEPCID ) 20 MG tablet Take 1 tablet (20 mg total) by mouth daily for 5 days. 09/19/23 09/24/23  Lindle Rhea, MD  metroNIDAZOLE  (FLAGYL ) 500 MG tablet Take 1 tablet (500 mg total) by mouth 2 (two) times daily. 07/02/22   Palumbo, April, MD  nitrofurantoin , macrocrystal-monohydrate, (MACROBID ) 100 MG capsule Take 1 capsule (100 mg total) by mouth 2 (two) times daily. X 7 days 07/02/22   Palumbo, April, MD  phenazopyridine  (PYRIDIUM ) 200 MG tablet Take 1 tablet (200 mg total) by mouth 3 (three) times daily. 07/02/22   Palumbo, April, MD  predniSONE  (STERAPRED UNI-PAK 21 TAB) 10 MG (21) TBPK tablet Take by mouth daily. Take 6 tabs by mouth daily  for 2 days, then 5 tabs for 2 days, then 4 tabs for 2 days, then 3 tabs for 2 days, 2 tabs for 2 days, then 1 tab  by mouth daily for 2 days 12/12/23   Felicie Horning, PA-C  predniSONE  (STERAPRED UNI-PAK 21 TAB) 10 MG (21) TBPK tablet Take by mouth daily. Take 6 tabs by mouth daily  for 2 days, then 5 tabs for 2 days, then 4 tabs for 2 days, then 3 tabs for 2 days, 2 tabs for 2 days, then 1 tab by mouth daily for 2 daysTake by mouth daily. 01/12/24   Sonnie Dusky, PA-C  vitamin B-12 (CYANOCOBALAMIN) 1000 MCG tablet Take 1 tablet (1,000 mcg total) by mouth daily. 07/21/20   Vann, Jessica U, DO      Allergies    Bactrim  [sulfamethoxazole -trimethoprim ], Penicillins, Amoxicillin, and Bactroban [mupirocin calcium]    Review of Systems   Review of Systems  Musculoskeletal:        LE swelling    Physical Exam Updated Vital Signs BP (!) 103/55 (BP Location: Left Arm)   Pulse 70   Temp 98.2 F (36.8 C) (Oral)   Resp 16   Ht 5\' 3"  (1.6 m)   Wt 61.2 kg   LMP 11/26/2023 (Approximate)   SpO2 100%   BMI 23.91 kg/m  Physical Exam Vitals and nursing note reviewed.  Constitutional:      General: She is not in acute distress.    Appearance: She is not ill-appearing or toxic-appearing.  HENT:  Head: Normocephalic and atraumatic.     Mouth/Throat:     Mouth: Mucous membranes are moist.  Eyes:     General: No scleral icterus.       Right eye: No discharge.        Left eye: No discharge.     Conjunctiva/sclera: Conjunctivae normal.  Cardiovascular:     Rate and Rhythm: Normal rate and regular rhythm.     Pulses: Normal pulses.     Heart sounds: Normal heart sounds. No murmur heard. Pulmonary:     Effort: Pulmonary effort is normal. No respiratory distress.     Breath sounds: Normal breath sounds. No wheezing, rhonchi or rales.  Abdominal:     General: Abdomen is flat.  Musculoskeletal:     Right lower leg: No edema.     Left lower leg: No edema.     Comments: Patient unwilling to take socks off for physical exam. I was able to palpate strong pedal pulses. No swelling, pitting edema, erythema,  or increased warmth appreciated of BL LE. Tenderness to palpation along great saphenous vein from mid calf to mid thigh.  Skin:    General: Skin is warm and dry.     Findings: No rash.  Neurological:     General: No focal deficit present.     Mental Status: She is alert. Mental status is at baseline.  Psychiatric:        Mood and Affect: Mood normal.     ED Results / Procedures / Treatments   Labs (all labs ordered are listed, but only abnormal results are displayed) Labs Reviewed - No data to display  EKG None  Radiology US  Venous Img Lower Bilateral (DVT) Result Date: 01/18/2024 CLINICAL DATA:  Pain and swelling x1 day EXAM: BILATERAL LOWER EXTREMITY VENOUS DOPPLER ULTRASOUND TECHNIQUE: Gray-scale sonography with compression, as well as color and duplex ultrasound, were performed to evaluate the deep venous system(s) from the level of the common femoral vein through the popliteal and proximal calf veins. COMPARISON:  None Available. FINDINGS: VENOUS Normal compressibility of the common femoral, superficial femoral, and popliteal veins, as well as the visualized calf veins. Visualized portions of profunda femoral vein and great saphenous vein unremarkable. No filling defects to suggest DVT on grayscale or color Doppler imaging. Doppler waveforms show normal direction of venous flow, normal respiratory plasticity and response to augmentation. OTHER Left inguinal lymph node 0.9 cm short axis diameter incidentally noted. Limitations: none IMPRESSION: Negative. Electronically Signed   By: Nicoletta Barrier M.D.   On: 01/18/2024 12:18    Procedures Procedures    Medications Ordered in ED Medications - No data to display  ED Course/ Medical Decision Making/ A&P                                 Medical Decision Making  This patient presents to the ED for concern of BL LE swelling and pain, this involves an extensive number of treatment options, and is a complaint that carries with it a high  risk of complications and morbidity.  The differential diagnosis includes cellulitis, abscess, compartment syndrome, DVT   Co morbidities that complicate the patient evaluation  Lupus, anemia   Additional history obtained:  No PCP listed in chart.  Will refer to community clinic.    Problem List / ED Course / Critical interventions / Medication management  Patient presents ED concern for BL LE swelling and  pain.  Physical exam with tenderness palpation along the great saphenous vein, but is otherwise reassuring and I am not appreciating swelling in her legs today.  Patient afebrile with stable vitals.  I have a very low suspicion that blood clots are causing her symptoms today, however given patient's increased worry, I will obtain imaging.  I ordered imaging studies including DVT US  . I independently visualized and interpreted imaging which showed 1 swollen lymph node but otherwise unremarkable. I agree with the radiologist interpretation Patient stating that she has a history of lymphadenopathy and is not having any infectious symptoms that wound be concerning for the inguinal lymphadenopathy today.  Nurse attempted to obtain labs and was in the middle of the needle stick when patient requested that nurse stop.  These lab draws were happening after the DVT study had already resulted.  Shared results with patient and answered all questions.  Shared decision making with patient who is ready to go home and is fine with withholding labs at this time.  Patient agrees to follow-up with primary care. I have reviewed the patients home medicines and have made adjustments as needed The patient has been appropriately medically screened and/or stabilized in the ED. I have low suspicion for any other emergent medical condition which would require further screening, evaluation or treatment in the ED or require inpatient management. At time of discharge the patient is hemodynamically stable and in no acute  distress. I have discussed work-up results and diagnosis with patient and answered all questions. Patient is agreeable with discharge plan. We discussed strict return precautions for returning to the emergency department and they verbalized understanding.     Social Determinants of Health:  none         Final Clinical Impression(s) / ED Diagnoses Final diagnoses:  Leg swelling    Rx / DC Orders ED Discharge Orders     None         [place], [place] 01/18/24 1315    Lowery Rue, DO 01/18/24 1321

## 2024-02-11 ENCOUNTER — Other Ambulatory Visit: Payer: Self-pay

## 2024-02-11 ENCOUNTER — Encounter (HOSPITAL_BASED_OUTPATIENT_CLINIC_OR_DEPARTMENT_OTHER): Payer: Self-pay

## 2024-02-11 ENCOUNTER — Emergency Department (HOSPITAL_BASED_OUTPATIENT_CLINIC_OR_DEPARTMENT_OTHER)
Admission: EM | Admit: 2024-02-11 | Discharge: 2024-02-11 | Disposition: A | Discharge status: Home / Self Care | Payer: Self-pay | Attending: Emergency Medicine | Admitting: Emergency Medicine

## 2024-02-11 DIAGNOSIS — M2550 Pain in unspecified joint: Secondary | ICD-10-CM | POA: Insufficient documentation

## 2024-02-11 MED ORDER — PREDNISONE 10 MG (21) PO TBPK: ORAL_TABLET | Freq: Every day | ORAL | 0 refills | Status: DC

## 2024-02-11 MED ORDER — DICLOFENAC SODIUM 1 % EX GEL: 2.0000 g | Freq: Four times a day (QID) | CUTANEOUS | 0 refills | Status: AC

## 2024-02-11 NOTE — ED Triage Notes (Signed)
 Pt arrives ambulatory to triage with reports of lupus flare up states that she needs a Rx for this. C/O generalized joint pain for 1-2 weeks.

## 2024-02-11 NOTE — ED Provider Notes (Signed)
 [place] EMERGENCY DEPARTMENT AT [facility] Provider Note   CSN: [id number] Arrival date & time: 02/11/24  2956     History  Chief Complaint  Patient presents with   Joint Pain    Katie Middleton is a 38 y.o. female.  38 year old female present to the emergency room with complaint of lupus flare with pain in her knees, ankles/feet.  Notes pain started about Friday (5 days ago).  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  Denies rashes, redness, fevers. No other complaints or concerns.        Home Medications Prior to Admission medications   Medication Sig Start Date End Date Taking? Authorizing Provider  diclofenac  Sodium (VOLTAREN ) 1 % GEL Apply 2 g topically 4 (four) times daily. 02/11/24  Yes Darlis Eisenmenger, PA-C  predniSONE  (STERAPRED UNI-PAK 21 TAB) 10 MG (21) TBPK tablet Take by mouth daily. Take 4 tabs by mouth daily  for 2 days, then 3 tabs for 2 days, then 2 tabs for 2 days, then 1 tabs for 2 days, 1/2 tablet daily to maintain control of process 02/11/24  Yes Darlis Eisenmenger, PA-C  EPINEPHrine  0.3 mg/0.3 mL IJ SOAJ injection Inject 0.3 mg into the muscle as needed for anaphylaxis. 09/19/23   Lindle Rhea, MD  famotidine  (PEPCID ) 20 MG tablet Take 1 tablet (20 mg total) by mouth daily for 5 days. 09/19/23 09/24/23  Lindle Rhea, MD      Allergies    Bactrim  [sulfamethoxazole -trimethoprim ], Penicillins, Amoxicillin, and Bactroban [mupirocin calcium]    Review of Systems   Review of Systems Negative except as per HPI Physical Exam Updated Vital Signs BP 101/72   Pulse 81   Temp 97.9 F (36.6 C) (Oral)   Resp 12   Ht 5\' 3"  (1.6 m)   Wt 64.9 kg   LMP 02/07/2024 (Approximate)   SpO2 98%   BMI 25.33 kg/m  Physical Exam Vitals and nursing note reviewed.  Constitutional:      General: She is not in acute distress.     Appearance: She is well-developed. She is not diaphoretic.  HENT:     Head: Normocephalic and atraumatic.  Pulmonary:     Effort: Pulmonary effort is normal.  Musculoskeletal:        General: Swelling and tenderness present. No deformity or signs of injury.     Right lower leg: No edema.     Left lower leg: No edema.     Comments: Mild swelling to bilateral knees. No appreciable swelling to ankles/feet. No erythema.   Skin:    General: Skin is warm and dry.     Findings: No bruising, erythema or rash.  Neurological:     Mental Status: She is alert and oriented to person, place, and time.     Sensory: No sensory deficit.     Motor: No weakness.     Gait: Gait normal.  Psychiatric:        Behavior: Behavior normal.     ED Results / Procedures / Treatments   Labs (all labs ordered are listed, but only abnormal results are displayed) Labs Reviewed - No data to display  EKG None  Radiology No results found.  Procedures Procedures    Medications Ordered in ED Medications - No data to display  ED Course/ Medical Decision Making/ A&P  Medical Decision Making  38 year old female with past medical history of lupus presents with complaint of pain in her knees and ankles, similar to prior flares.  Denies tick bites, rash, redness or fever.  States that she does not have insurance currently and is unable to see rheumatology for management.  Comes to the ER regularly for prednisone  taper and is here requesting same.  Review of records, seen monthly with prednisone  taper.  Will trial longer lower dose taper in hopes of acquiring control of her pain.  Also provided with topical Voltaren  gel.  Discussed risks of frequent prednisone  use.  Patient verbalizes understanding.        Final Clinical Impression(s) / ED Diagnoses Final diagnoses:  Polyarthralgia    Rx / DC Orders ED Discharge Orders          Ordered    diclofenac  Sodium (VOLTAREN )  1 % GEL  4 times daily        02/11/24 1159    predniSONE  (STERAPRED UNI-PAK 21 TAB) 10 MG (21) TBPK tablet  Daily        02/11/24 1159              Erna He 02/11/24 1205    Rosealee Concha, MD 02/11/24 [phone]

## 2024-02-11 NOTE — Discharge Instructions (Addendum)
 Voltaren  gel topically to joints to help with pain relief.  Prednisone  taper provided. Prescription written differently this time to trial loading dose with gradual taper to 5mg  daily (1/2 tablet). Hopefully this will provide better relief and not require monthly/frequent visits for higher doses.   As discussed, prednisone  can impact joints negatively and eventually lead to osteoporosis. Please follow up with rheumatology as soon a possible. Contact [facility] for primary care and assistance with community resources.

## 2024-02-11 NOTE — ED Notes (Signed)
 Reviewed AVS/discharge instruction with patient. Time allotted for and all questions answered. Patient is agreeable for d/c and escorted to ed exit by staff.

## 2024-08-04 ENCOUNTER — Other Ambulatory Visit: Payer: Self-pay

## 2024-08-04 ENCOUNTER — Emergency Department (HOSPITAL_COMMUNITY): Payer: Self-pay

## 2024-08-04 ENCOUNTER — Emergency Department (HOSPITAL_COMMUNITY)
Admission: EM | Admit: 2024-08-04 | Discharge: 2024-08-04 | Disposition: A | Discharge status: Home / Self Care | Payer: Self-pay | Attending: Emergency Medicine | Admitting: Emergency Medicine

## 2024-08-04 DIAGNOSIS — R935 Abnormal findings on diagnostic imaging of other abdominal regions, including retroperitoneum: Secondary | ICD-10-CM | POA: Insufficient documentation

## 2024-08-04 DIAGNOSIS — K760 Fatty (change of) liver, not elsewhere classified: Secondary | ICD-10-CM | POA: Insufficient documentation

## 2024-08-04 DIAGNOSIS — R1012 Left upper quadrant pain: Secondary | ICD-10-CM | POA: Insufficient documentation

## 2024-08-04 LAB — URINALYSIS, ROUTINE W REFLEX MICROSCOPIC
Bacteria, UA: NONE SEEN
Bilirubin Urine: NEGATIVE
Glucose, UA: NEGATIVE mg/dL
Hgb urine dipstick: NEGATIVE
Ketones, ur: NEGATIVE mg/dL
Leukocytes,Ua: NEGATIVE
Nitrite: NEGATIVE
Protein, ur: >=300 mg/dL — AB
Specific Gravity, Urine: 1.027 (ref 1.005–1.030)
pH: 5 (ref 5.0–8.0)

## 2024-08-04 LAB — COMPREHENSIVE METABOLIC PANEL WITH GFR
ALT: <5 U/L (ref 0–44)
AST: 26 U/L (ref 15–41)
Albumin: 3.4 g/dL — ABNORMAL LOW (ref 3.5–5.0)
Alkaline Phosphatase: 66 U/L (ref 38–126)
Anion gap: 6 (ref 5–15)
BUN: 10 mg/dL (ref 6–20)
CO2: 25 mmol/L (ref 22–32)
Calcium: 8.4 mg/dL — ABNORMAL LOW (ref 8.9–10.3)
Chloride: 106 mmol/L (ref 98–111)
Creatinine, Ser: 0.55 mg/dL (ref 0.44–1.00)
GFR, Estimated: >60 mL/min (ref >60)
Glucose, Bld: 79 mg/dL (ref 70–99)
Potassium: 4.2 mmol/L (ref 3.5–5.1)
Sodium: 137 mmol/L (ref 135–145)
Total Bilirubin: 0.4 mg/dL (ref 0.0–1.2)
Total Protein: 7.9 g/dL (ref 6.5–8.1)

## 2024-08-04 LAB — CBC
HCT: 32.2 % — ABNORMAL LOW (ref 36.0–46.0)
Hemoglobin: 9.7 g/dL — ABNORMAL LOW (ref 12.0–15.0)
MCH: 24.9 pg — ABNORMAL LOW (ref 26.0–34.0)
MCHC: 30.1 g/dL (ref 30.0–36.0)
MCV: 82.6 fL (ref 80.0–100.0)
Platelets: 236 K/uL (ref 150–400)
RBC: 3.9 MIL/uL (ref 3.87–5.11)
RDW: 21.7 % — ABNORMAL HIGH (ref 11.5–15.5)
WBC: 4.7 K/uL (ref 4.0–10.5)
nRBC: 0 % (ref 0.0–0.2)

## 2024-08-04 LAB — LIPASE, BLOOD: Lipase: 36 U/L (ref 11–51)

## 2024-08-04 LAB — HCG, SERUM, QUALITATIVE: Preg, Serum: NEGATIVE

## 2024-08-04 MED ORDER — IOHEXOL 300 MG/ML  SOLN
100.0000 mL | Freq: Once | INTRAMUSCULAR | Status: AC | PRN
  Administered 2024-08-04: 100 mL via INTRAVENOUS

## 2024-08-04 MED ORDER — SUCRALFATE 1 G PO TABS: 1.0000 g | ORAL_TABLET | Freq: Three times a day (TID) | ORAL | 0 refills | Status: AC

## 2024-08-04 MED ORDER — OXYCODONE HCL 5 MG PO TABS
5.0000 mg | ORAL_TABLET | Freq: Once | ORAL | Status: AC
  Administered 2024-08-04: 5 mg via ORAL
  Filled 2024-08-04: qty 1

## 2024-08-04 MED ORDER — PANTOPRAZOLE SODIUM 20 MG PO TBEC: 20.0000 mg | DELAYED_RELEASE_TABLET | Freq: Every day | ORAL | 0 refills | Status: AC

## 2024-08-04 NOTE — Discharge Instructions (Signed)
 Begin taking Carafate and Protonix as prescribed.  Stay hydrated and drink fluids.  Keep diet bland while experiencing symptoms.  Please follow-up with GI for further evaluation and management of continued abdominal pain.  Please also follow-up for potential outpatient MRI.  Return to ED sooner if any symptoms worsen including severe uncontrollable pain, new fevers, uncontrollable nausea/vomiting/diarrhea.

## 2024-08-04 NOTE — ED Triage Notes (Addendum)
 Pt ambulatory to triage with LEFT sided abdominal pain spasm-like in nature that began last Thursday. Pt reports that her pain is worse when eating. Pt states that she initially thought the pain was gas, but it has not resolved. Pt reports regular soft bowel movements. Denies Nausea, Denies vomiting, Denies urinary complaints.  Hx of lupus.

## 2024-08-04 NOTE — ED Provider Triage Note (Signed)
 Emergency Medicine Provider Triage Evaluation Note  Katie Middleton , a 38 y.o. female  was evaluated in triage.  Pt complains of left sided abd pain. Worsens following eating. Last BM Monday - normally goes every 2-3 days and normal BM. Passing gas Described as tight cramp. Putting pressure on it helps with pain.  Bilateral tubal ligation 16 yrs ago. No additional abd surgeries Pain today wo eating but came in for ED eval as pain worsened. No NVD, fever No recent travel, abx Hx of lupus, recently came off of prednisone   Review of Systems  Positive: See hpi Negative:   Physical Exam  BP (!) 94/57   Pulse 77   Temp 98.4 F (36.9 C) (Oral)   Resp 16   SpO2 100%  Gen:   Awake, no distress   Resp:  Normal effort  MSK:   Moves extremities without difficulty  Other:  Generalized TTP worse in epigastric, RLQ, LUQ  Medical Decision Making  Medically screening exam initiated at 6:09 PM.  Appropriate orders placed.  Katie Middleton was informed that the remainder of the evaluation will be completed by another provider, this initial triage assessment does not replace that evaluation, and the importance of remaining in the ED until their evaluation is complete.  Labs wo leukocytosis. Negative hcg. CT ordered   Minnie Tinnie BRAVO, PA 08/04/24 [phone]

## 2024-08-04 NOTE — ED Provider Notes (Signed)
 [place] EMERGENCY DEPARTMENT AT [facility] Provider Note   CSN: [id number] Arrival date & time: 08/04/24  1325     Patient presents with: Abdominal Pain   Katie Middleton is a 38 y.o. female.   Patient is a 38 year old female with a history of lupus who presents to the ED for left-sided abdominal pain for the past week.  Patient notes the pain has been intermittent and describes the sensation as cramping.  She notes she was initially constipated before the pain began.  She increased her fiber and drink apple juice and has been having regular bowel movements all week.  States she has a history of a tubal ligation but no other abdominal surgeries.  Notes her pain is a 2 out of 10 or now.  States last menstrual cycle was 2 weeks prior.  Denies fevers, chills, headache, chest pain, shortness of breath, nausea/vomiting/diarrhea, urinary symptoms, pain/discharge.  No further complaints.   Abdominal Pain Associated symptoms: no chest pain, no constipation, no diarrhea, no dysuria, no fever, no nausea, no shortness of breath and no vomiting        Prior to Admission medications   Medication Sig Start Date End Date Taking? Authorizing Provider  pantoprazole  (PROTONIX ) 20 MG tablet Take 1 tablet (20 mg total) by mouth daily. 08/04/24 09/03/24 Yes Alie Hardgrove, Thersia RAMAN, PA-C  sucralfate  (CARAFATE ) 1 g tablet Take 1 tablet (1 g total) by mouth 4 (four) times daily -  with meals and at bedtime for 15 days. 08/04/24 08/19/24 Yes Aaliyana Fredericks, Thersia RAMAN, PA-C  diclofenac  Sodium (VOLTAREN ) 1 % GEL Apply 2 g topically 4 (four) times daily. 02/11/24   Beverley Leita LABOR, PA-C  EPINEPHrine  0.3 mg/0.3 mL IJ SOAJ injection Inject 0.3 mg into the muscle as needed for anaphylaxis. 09/19/23   Trine Raynell Moder, MD  famotidine  (PEPCID ) 20 MG tablet Take 1 tablet (20 mg total) by mouth daily for 5 days. 09/19/23 09/24/23  Trine Raynell Moder, MD  predniSONE  (STERAPRED UNI-PAK 21 TAB) 10 MG (21) TBPK tablet Take by  mouth daily. Take 4 tabs by mouth daily  for 2 days, then 3 tabs for 2 days, then 2 tabs for 2 days, then 1 tabs for 2 days, 1/2 tablet daily to maintain control of process 02/11/24   Beverley Leita LABOR, PA-C    Allergies: Bactrim  [sulfamethoxazole -trimethoprim ], Penicillins, Amoxicillin, and Bactroban [mupirocin calcium]    Review of Systems  Constitutional:  Negative for fever.  Respiratory:  Negative for shortness of breath.   Cardiovascular:  Negative for chest pain.  Gastrointestinal:  Positive for abdominal pain. Negative for constipation, diarrhea, nausea and vomiting.  Genitourinary:  Negative for dysuria.  All other systems reviewed and are negative.   Updated Vital Signs BP (!) 100/58 (BP Location: Right Arm)   Pulse 85   Temp 98 F (36.7 C) (Oral)   Resp 18   SpO2 100%   Physical Exam Constitutional:      Appearance: She is well-developed.  HENT:     Head: Normocephalic and atraumatic.  Cardiovascular:     Rate and Rhythm: Normal rate.  Pulmonary:     Effort: Pulmonary effort is normal.  Abdominal:     General: Bowel sounds are normal.     Palpations: Abdomen is soft.     Tenderness: There is abdominal tenderness in the left upper quadrant.  Skin:    General: Skin is warm and dry.  Neurological:     Mental Status: She is alert and oriented  to person, place, and time.  Psychiatric:        Mood and Affect: Mood normal.        Behavior: Behavior normal.     (all labs ordered are listed, but only abnormal results are displayed) Labs Reviewed  COMPREHENSIVE METABOLIC PANEL WITH GFR - Abnormal; Notable for the following components:      Result Value   Calcium 8.4 (*)    Albumin 3.4 (*)    All other components within normal limits  CBC - Abnormal; Notable for the following components:   Hemoglobin 9.7 (*)    HCT 32.2 (*)    MCH 24.9 (*)    RDW 21.7 (*)    All other components within normal limits  URINALYSIS, ROUTINE W REFLEX MICROSCOPIC - Abnormal; Notable  for the following components:   APPearance HAZY (*)    Protein, ur >=300 (*)    All other components within normal limits  LIPASE, BLOOD  HCG, SERUM, QUALITATIVE    EKG: None  Radiology: CT ABDOMEN PELVIS W CONTRAST Result Date: 08/04/2024 CLINICAL DATA:  Left-sided abdominal pain. EXAM: CT ABDOMEN AND PELVIS WITH CONTRAST TECHNIQUE: Multidetector CT imaging of the abdomen and pelvis was performed using the standard protocol following bolus administration of intravenous contrast. RADIATION DOSE REDUCTION: This exam was performed according to the departmental dose-optimization program which includes automated exposure control, adjustment of the mA and/or kV according to patient size and/or use of iterative reconstruction technique. CONTRAST:  OMNIPAQUE  IOHEXOL  300 MG/ML  SOLN COMPARISON:  July 18, 2020 FINDINGS: Lower chest: Mild atelectasis is seen within the left lung base. Hepatobiliary: There is diffuse fatty infiltration of the left lobe of the liver and a large portion of the anterior aspect of the right lobe the liver. The posterior aspect of the right lobe of the liver demonstrates normal parenchymal attenuation, which is slightly heterogeneous in appearance. A 10 mm isodense focus is seen within the posterior aspect of the right lobe of the liver (Segment V), adjacent to the right kidney (axial CT image 26, CT series 2). No gallstones, gallbladder wall thickening, or biliary dilatation. Pancreas: Unremarkable. No pancreatic ductal dilatation or surrounding inflammatory changes. Spleen: The spleen is mildly enlarged without focal abnormality. Adrenals/Urinary Tract: Adrenal glands are unremarkable. Kidneys are normal, without renal calculi, focal lesion, or hydronephrosis. Bladder is unremarkable. Stomach/Bowel: Stomach is within normal limits. Appendix appears normal. A large amount of stool is seen within the ascending and transverse colon no evidence of bowel wall thickening,  distention, or inflammatory changes. Vascular/Lymphatic: No significant vascular findings are present. No enlarged abdominal or pelvic lymph nodes. Reproductive: The uterus is mildly enlarged and heterogeneous in appearance. A 2.1 cm cyst is seen within the posterior aspect of the right adnexa. A 2.5 cm left adnexal cyst is also noted. Other: No abdominal wall hernia or abnormality. A very small amount of posterior pelvic free fluid is seen. Musculoskeletal: No acute or significant osseous findings. IMPRESSION: 1. Heterogeneous fatty infiltration of the liver with a large area of suspected fatty sparing. Correlation with nonemergent MRI is recommended to exclude the presence of an underlying neoplastic process. 2. 10 mm isodense focus within the posterior aspect of the right lobe of the liver (Segment V), which may represent a small hemangioma. Correlation with nonemergent MRI is recommended. 3. Mild splenomegaly. 4. Bilateral adnexal cysts, likely ovarian in origin. Electronically Signed   By: Suzen Dials M.D.   On: 08/04/2024 21:20     Medications Ordered  in the ED  oxyCODONE  (Oxy IR/ROXICODONE ) immediate release tablet 5 mg (5 mg Oral Given 08/04/24 1827)  iohexol  (OMNIPAQUE ) 300 MG/ML solution 100 mL (100 mLs Intravenous Contrast Given 08/04/24 2037)                                   Medical Decision Making Amount and/or Complexity of Data Reviewed Labs: ordered.  Risk Prescription drug management.   Patient is a 38 year old female with a history of lupus who presents to the ED for left-sided abdominal pain for the past week.  Denies any other associated symptoms.  Please see detailed HPI above.  On exam patient is alert and in no acute distress.  Physical exam as above.  Lab workup overall unremarkable.  Patient noted to have chronic anemia with a hemoglobin of 5.7 which is improved to baseline.  Labs otherwise reassuring.  No leukocytosis and normal creatinine.  hCG negative.  UA  negative for acute infection.  CT of the abdomen pelvis reviewed shows fatty infiltration of the liver as well as a 10 mm acetones focus in the posterior aspect of the right lobe of the liver.  Nonemergent MRI is recommended.  Otherwise mild splenomegaly and bilateral adnexal cysts but otherwise no acute abnormalities.  Differential includes acute viral illness, gastritis, colitis, IBS, IBD, nephrolithiasis, acute abdomen.  Patient was given oxycodone  while in ED with improvement in pain.  Suspect patient's symptoms are more likely secondary to gastritis.  Otherwise well-appearing and tolerating oral intake.  Vital signs stable.  Stable for discharge home.  Prescribed Carafate  and Protonix .  Symptomatic care discussed.  Advised GI follow-up for further evaluation management of continued pain as well as outpatient MRI.  Return precautions provided for worsening symptoms.    Final diagnoses:  Left upper quadrant abdominal pain  Abnormal CT of the abdomen  Fatty liver    ED Discharge Orders          Ordered    sucralfate  (CARAFATE ) 1 g tablet  3 times daily with meals & bedtime        08/04/24 2147    pantoprazole  (PROTONIX ) 20 MG tablet  Daily        08/04/24 2147               Neysa Thersia GORMAN DEVONNA 08/04/24 2152    Randol Simmonds, MD 08/06/24 [phone]

## 2024-08-22 ENCOUNTER — Other Ambulatory Visit: Payer: Self-pay

## 2024-08-22 ENCOUNTER — Encounter (HOSPITAL_COMMUNITY): Payer: Self-pay

## 2024-08-22 ENCOUNTER — Emergency Department (HOSPITAL_COMMUNITY)
Admission: EM | Admit: 2024-08-22 | Discharge: 2024-08-22 | Disposition: A | Discharge status: Home / Self Care | Payer: MEDICAID | Attending: Emergency Medicine | Admitting: Emergency Medicine

## 2024-08-22 ENCOUNTER — Emergency Department (HOSPITAL_COMMUNITY): Payer: MEDICAID

## 2024-08-22 DIAGNOSIS — K760 Fatty (change of) liver, not elsewhere classified: Secondary | ICD-10-CM

## 2024-08-22 DIAGNOSIS — R1011 Right upper quadrant pain: Secondary | ICD-10-CM

## 2024-08-22 LAB — CBC
HCT: 33.3 % — ABNORMAL LOW (ref 36.0–46.0)
Hemoglobin: 10 g/dL — ABNORMAL LOW (ref 12.0–15.0)
MCH: 25.3 pg — ABNORMAL LOW (ref 26.0–34.0)
MCHC: 30 g/dL (ref 30.0–36.0)
MCV: 84.1 fL (ref 80.0–100.0)
Platelets: 208 K/uL (ref 150–400)
RBC: 3.96 MIL/uL (ref 3.87–5.11)
RDW: 20.1 % — ABNORMAL HIGH (ref 11.5–15.5)
WBC: 6.7 K/uL (ref 4.0–10.5)
nRBC: 0 % (ref 0.0–0.2)

## 2024-08-22 LAB — URINALYSIS, ROUTINE W REFLEX MICROSCOPIC
Bacteria, UA: NONE SEEN
Bilirubin Urine: NEGATIVE
Glucose, UA: NEGATIVE mg/dL
Ketones, ur: NEGATIVE mg/dL
Nitrite: NEGATIVE
Protein, ur: >=300 mg/dL — AB
RBC / HPF: >50 RBC/hpf (ref 0–5)
Specific Gravity, Urine: 1.026 (ref 1.005–1.030)
pH: 5 (ref 5.0–8.0)

## 2024-08-22 LAB — RESP PANEL BY RT-PCR (RSV, FLU A&B, COVID)  RVPGX2
Influenza A by PCR: NEGATIVE
Influenza B by PCR: NEGATIVE
Resp Syncytial Virus by PCR: NEGATIVE
SARS Coronavirus 2 by RT PCR: NEGATIVE

## 2024-08-22 LAB — COMPREHENSIVE METABOLIC PANEL WITH GFR
ALT: <5 U/L (ref 0–44)
AST: 18 U/L (ref 15–41)
Albumin: 3.6 g/dL (ref 3.5–5.0)
Alkaline Phosphatase: 59 U/L (ref 38–126)
Anion gap: 7 (ref 5–15)
BUN: 12 mg/dL (ref 6–20)
CO2: 28 mmol/L (ref 22–32)
Calcium: 9 mg/dL (ref 8.9–10.3)
Chloride: 104 mmol/L (ref 98–111)
Creatinine, Ser: 0.6 mg/dL (ref 0.44–1.00)
GFR, Estimated: >60 mL/min (ref >60)
Glucose, Bld: 88 mg/dL (ref 70–99)
Potassium: 3.9 mmol/L (ref 3.5–5.1)
Sodium: 139 mmol/L (ref 135–145)
Total Bilirubin: 0.4 mg/dL (ref 0.0–1.2)
Total Protein: 8.3 g/dL — ABNORMAL HIGH (ref 6.5–8.1)

## 2024-08-22 LAB — LIPASE, BLOOD: Lipase: 15 U/L (ref 11–51)

## 2024-08-22 LAB — HCG, SERUM, QUALITATIVE: Preg, Serum: NEGATIVE

## 2024-08-22 NOTE — ED Triage Notes (Signed)
 Pt came in for right sided abdominal pain for a couple days. Pt also stated it hurts when she urinates but there's no blood or retention. Pt has not thrown up or had constipation as well.

## 2024-08-22 NOTE — ED Notes (Signed)
Poison con

## 2024-08-22 NOTE — Discharge Instructions (Signed)
 It was a pleasure taking care of you today. You were seen in the Emergency Department for evaluation of right-sided abdominal pain. Your work-up was reassuring. Your lab work and ultrasound showed no acute abnormality to explain your symptoms.  As discussed, you did have a CT scan in the middle of November, which showed a fatty liver with recommendation for a nonemergent MRI.  I do recommend following up with GI for further evaluation as well.  I provided you with a referral on your discharge paperwork.  You will need to call them to schedule an appointment.  Refer to the attached documentation for further management of your symptoms.   Please return to the ER if you experience chest pain, trouble breathing, intractable nausea/vomiting or any other life threatening illnesses.

## 2024-08-22 NOTE — ED Provider Notes (Signed)
 [place] EMERGENCY DEPARTMENT AT [facility] Provider Note   CSN: [id number] Arrival date & time: 08/22/24  9295     Patient presents with: Abdominal Pain   Katie Middleton is a 38 y.o. female with past medical history of lupus who presents emergency department for evaluation of right-sided abdominal pain.  Patient reports her pain started a couple of days ago, and it worsens when she urinates.  She denies any nausea or vomiting.  She is currently on her menstrual cycle, so is unable to tell if she has any hematuria.  She denies any changes to her bowel movements or back pain.  Of note, she was seen in the emergency department approximately 2 weeks ago for left-sided abdominal pain.  She was told she had constipation, and that improved when she had a bowel movement.  She does take daily Plaquenil and 5 mg of prednisone  for her lupus.  She has not taken any other medicine for this abdominal pain.   Abdominal Pain      Prior to Admission medications   Medication Sig Start Date End Date Taking? Authorizing Provider  diclofenac  Sodium (VOLTAREN ) 1 % GEL Apply 2 g topically 4 (four) times daily. 02/11/24   Beverley Leita LABOR, PA-C  EPINEPHrine  0.3 mg/0.3 mL IJ SOAJ injection Inject 0.3 mg into the muscle as needed for anaphylaxis. 09/19/23   Trine Raynell Moder, MD  famotidine  (PEPCID ) 20 MG tablet Take 1 tablet (20 mg total) by mouth daily for 5 days. 09/19/23 09/24/23  Trine Raynell Moder, MD  pantoprazole  (PROTONIX ) 20 MG tablet Take 1 tablet (20 mg total) by mouth daily. 08/04/24 09/03/24  Neysa Thersia RAMAN, PA-C  predniSONE  (STERAPRED UNI-PAK 21 TAB) 10 MG (21) TBPK tablet Take by mouth daily. Take 4 tabs by mouth daily  for 2 days, then 3 tabs for 2 days, then 2 tabs for 2 days, then 1 tabs for 2 days, 1/2 tablet daily to maintain control of process 02/11/24   Beverley Leita LABOR, PA-C  sucralfate  (CARAFATE ) 1 g tablet Take 1 tablet (1 g total) by mouth 4 (four) times daily -  with meals  and at bedtime for 15 days. 08/04/24 08/19/24  Neysa Thersia RAMAN, PA-C    Allergies: Bactrim  [sulfamethoxazole -trimethoprim ], Penicillins, Amoxicillin, and Bactroban [mupirocin calcium]    Review of Systems  Gastrointestinal:  Positive for abdominal pain.    Updated Vital Signs BP 102/63   Pulse 100   Temp 98.1 F (36.7 C) (Oral)   Resp 18   Ht 5' 3 (1.6 m)   Wt 65.8 kg   SpO2 100%   BMI 25.69 kg/m   Physical Exam Vitals and nursing note reviewed.  Constitutional:      Appearance: Normal appearance.  HENT:     Head: Normocephalic and atraumatic.     Mouth/Throat:     Mouth: Mucous membranes are moist.  Eyes:     General: No scleral icterus.       Right eye: No discharge.        Left eye: No discharge.     Conjunctiva/sclera: Conjunctivae normal.  Cardiovascular:     Rate and Rhythm: Normal rate and regular rhythm.     Pulses: Normal pulses.  Pulmonary:     Effort: Pulmonary effort is normal.     Breath sounds: Normal breath sounds.  Abdominal:     General: There is no distension.     Tenderness: There is abdominal tenderness in the right upper quadrant and right  lower quadrant. There is guarding.     Comments: RUQ and RLQ tenderness to palpation.  Minimal guarding.  Musculoskeletal:        General: No deformity.     Cervical back: Normal range of motion.  Skin:    General: Skin is warm and dry.     Capillary Refill: Capillary refill takes less than 2 seconds.  Neurological:     Mental Status: She is alert.     Motor: No weakness.  Psychiatric:        Mood and Affect: Mood normal.     (all labs ordered are listed, but only abnormal results are displayed) Labs Reviewed  COMPREHENSIVE METABOLIC PANEL WITH GFR - Abnormal; Notable for the following components:      Result Value   Total Protein 8.3 (*)    All other components within normal limits  CBC - Abnormal; Notable for the following components:   Hemoglobin 10.0 (*)    HCT 33.3 (*)    MCH 25.3 (*)     RDW 20.1 (*)    All other components within normal limits  URINALYSIS, ROUTINE W REFLEX MICROSCOPIC - Abnormal; Notable for the following components:   Color, Urine AMBER (*)    APPearance CLOUDY (*)    Hgb urine dipstick LARGE (*)    Protein, ur >=300 (*)    Leukocytes,Ua TRACE (*)    All other components within normal limits  RESP PANEL BY RT-PCR (RSV, FLU A&B, COVID)  RVPGX2  LIPASE, BLOOD  HCG, SERUM, QUALITATIVE    EKG: None  Radiology: US  Abdomen Limited Result Date: 08/22/2024 EXAM: Right Upper Quadrant Abdominal Ultrasound 08/22/2024 09:02:00 AM TECHNIQUE: Real-time ultrasonography of the right upper quadrant of the abdomen was performed. COMPARISON: None available. CLINICAL HISTORY: RUQ abd pain. FINDINGS: LIVER: The liver demonstrates normal echogenicity. No intrahepatic biliary ductal dilatation. No evidence of mass. BILIARY SYSTEM: Gallbladder wall thickness measures 2.0 mm. No pericholecystic fluid. No cholelithiasis. Common bile duct is within normal limits measuring 1.4 mm. OTHER: No right upper quadrant ascites. IMPRESSION: 1. No acute findings. Electronically signed by: Evalene Coho MD 08/22/2024 09:08 AM EST RP Workstation: [id number]    Procedures   Medications Ordered in the ED - No data to display  Clinical Course as of 08/22/24 0938  [facility] Aug 22, 2024  9261 Patient has been having right-sided abdominal pain for the last couple of days.  She was seen in the emergency department 2 weeks ago and obtained a CT which showed CT of the abdomen pelvis reviewed shows fatty infiltration of the liver as well as a 10 mm acetones focus in the posterior aspect of the right lobe of the liver.  Nonemergent MRI is recommended.  Otherwise mild splenomegaly and bilateral adnexal cysts but otherwise no acute abnormalities.  At that time, she was given oxycodone  for pain management [GD]  [phone] Patient's lab work is unremarkable.  There is no evidence of UTI, respiratory infection, or  any other acute abnormality that would explain patient's symptoms.  She did have a right upper quadrant ultrasound today due to her RUQ tenderness.  That was without abnormality.  I suspect patient's symptoms are likely secondary to her fatty liver that was found on CT in November.  Recommendation for nonemergent outpatient MRI.  I did explain these findings to the patient, who verbalized understanding.  Patient's vital signs are stable.  Patient is appropriate for discharge at this time. [GD]    Clinical Course User Index [GD] Torrence,  Marry RAMAN, PA-C                                Medical Decision Making Amount and/or Complexity of Data Reviewed Labs: ordered. Radiology: ordered.   38 year old female who presents emergency department for evaluation of right-sided abdominal pain.  Differential diagnosis: Cholecystitis, appendicitis, gastroenteritis, GERD, IBS, fatty liver, mass    Final diagnoses:  None    ED Discharge Orders     None          Torrence Marry RAMAN, PA-C 08/22/24 0941    Charlyn Sora, MD 08/23/24 [id number]

## 2024-08-25 ENCOUNTER — Emergency Department (HOSPITAL_BASED_OUTPATIENT_CLINIC_OR_DEPARTMENT_OTHER): Payer: Self-pay

## 2024-08-25 ENCOUNTER — Other Ambulatory Visit: Payer: Self-pay

## 2024-08-25 ENCOUNTER — Encounter (HOSPITAL_BASED_OUTPATIENT_CLINIC_OR_DEPARTMENT_OTHER): Payer: Self-pay | Admitting: Emergency Medicine

## 2024-08-25 ENCOUNTER — Emergency Department (HOSPITAL_BASED_OUTPATIENT_CLINIC_OR_DEPARTMENT_OTHER)
Admission: EM | Admit: 2024-08-25 | Discharge: 2024-08-25 | Disposition: A | Discharge status: Home / Self Care | Payer: Self-pay | Attending: Emergency Medicine | Admitting: Emergency Medicine

## 2024-08-25 DIAGNOSIS — J181 Lobar pneumonia, unspecified organism: Secondary | ICD-10-CM | POA: Insufficient documentation

## 2024-08-25 DIAGNOSIS — I3139 Other pericardial effusion (noninflammatory): Secondary | ICD-10-CM | POA: Insufficient documentation

## 2024-08-25 DIAGNOSIS — J189 Pneumonia, unspecified organism: Secondary | ICD-10-CM

## 2024-08-25 LAB — BASIC METABOLIC PANEL WITH GFR
Anion gap: 12 (ref 5–15)
BUN: 13 mg/dL (ref 6–20)
CO2: 23 mmol/L (ref 22–32)
Calcium: 8.6 mg/dL — ABNORMAL LOW (ref 8.9–10.3)
Chloride: 103 mmol/L (ref 98–111)
Creatinine, Ser: 0.68 mg/dL (ref 0.44–1.00)
GFR, Estimated: >60 mL/min (ref >60)
Glucose, Bld: 85 mg/dL (ref 70–99)
Potassium: 3.7 mmol/L (ref 3.5–5.1)
Sodium: 137 mmol/L (ref 135–145)

## 2024-08-25 LAB — CBC WITH DIFFERENTIAL/PLATELET
Abs Immature Granulocytes: 0.02 K/uL (ref 0.00–0.07)
Basophils Absolute: 0 K/uL (ref 0.0–0.1)
Basophils Relative: 0 %
Eosinophils Absolute: 0.2 K/uL (ref 0.0–0.5)
Eosinophils Relative: 4 %
HCT: 31.8 % — ABNORMAL LOW (ref 36.0–46.0)
Hemoglobin: 9.8 g/dL — ABNORMAL LOW (ref 12.0–15.0)
Immature Granulocytes: 0 %
Lymphocytes Relative: 11 %
Lymphs Abs: 0.5 K/uL — ABNORMAL LOW (ref 0.7–4.0)
MCH: 25.1 pg — ABNORMAL LOW (ref 26.0–34.0)
MCHC: 30.8 g/dL (ref 30.0–36.0)
MCV: 81.3 fL (ref 80.0–100.0)
Monocytes Absolute: 0.2 K/uL (ref 0.1–1.0)
Monocytes Relative: 4 %
Neutro Abs: 4 K/uL (ref 1.7–7.7)
Neutrophils Relative %: 81 %
Platelets: 233 K/uL (ref 150–400)
RBC: 3.91 MIL/uL (ref 3.87–5.11)
RDW: 18.8 % — ABNORMAL HIGH (ref 11.5–15.5)
WBC: 4.9 K/uL (ref 4.0–10.5)
nRBC: 0 % (ref 0.0–0.2)

## 2024-08-25 LAB — RESP PANEL BY RT-PCR (RSV, FLU A&B, COVID)  RVPGX2
Influenza A by PCR: NEGATIVE
Influenza B by PCR: NEGATIVE
Resp Syncytial Virus by PCR: NEGATIVE
SARS Coronavirus 2 by RT PCR: NEGATIVE

## 2024-08-25 MED ORDER — LEVOFLOXACIN 250 MG PO TABS: 750.0000 mg | ORAL_TABLET | Freq: Every day | ORAL | 0 refills | Status: AC

## 2024-08-25 MED ORDER — KETOROLAC TROMETHAMINE 15 MG/ML IJ SOLN
15.0000 mg | Freq: Once | INTRAMUSCULAR | Status: AC
  Administered 2024-08-25: 15 mg via INTRAVENOUS
  Filled 2024-08-25: qty 1

## 2024-08-25 MED ORDER — IOHEXOL 350 MG/ML SOLN
75.0000 mL | Freq: Once | INTRAVENOUS | Status: AC | PRN
  Administered 2024-08-25: 75 mL via INTRAVENOUS

## 2024-08-25 MED ORDER — LEVOFLOXACIN 750 MG PO TABS
750.0000 mg | ORAL_TABLET | Freq: Once | ORAL | Status: AC
  Administered 2024-08-25: 750 mg via ORAL
  Filled 2024-08-25: qty 1

## 2024-08-25 MED ORDER — PREDNISONE 10 MG (21) PO TBPK: ORAL_TABLET | Freq: Every day | ORAL | 0 refills | Status: DC

## 2024-08-25 NOTE — ED Triage Notes (Signed)
 Pt c/o SHOB and cough x a few days, NAD at this time

## 2024-08-25 NOTE — Discharge Instructions (Signed)
 Today you were seen for shortness of breath and cough.  You were found to have pneumonia and a small pericardial effusion.  Please pick up your antibiotics and steroids and take as directed.  Please follow-up with cardiology as soon as possible for further evaluation and workup.  Please return to the ED if you have worsening symptoms, difficulty breathing, or fever that does not go down with Tylenol  or Motrin.  Thank you for letting us  treat you today. After reviewing your labs and imaging, I feel you are safe to go home. Please follow up with your PCP in the next several days and provide them with your records from this visit. Return to the Emergency Room if pain becomes severe or symptoms worsen.

## 2024-08-25 NOTE — ED Provider Notes (Signed)
 [place] EMERGENCY DEPARTMENT AT [facility] Provider Note   CSN: [id number] Arrival date & time: 08/25/24  1357     Patient presents with: Shortness of Breath and Cough   Katie Middleton is a 38 y.o. female past medical history significant for lupus and Lyme disease presents today for shortness of breath and cough times a few days.  Patient also reports congestion.  Patient denies fever, chills, nausea, vomiting, chest pain, or abdominal pain.    Shortness of Breath Associated symptoms: cough   Cough Associated symptoms: shortness of breath        Prior to Admission medications   Medication Sig Start Date End Date Taking? Authorizing Provider  levofloxacin  (LEVAQUIN ) 250 MG tablet Take 3 tablets (750 mg total) by mouth daily for 4 days. 08/25/24 08/29/24 Yes Francis Ileana SAILOR, PA-C  predniSONE  (STERAPRED UNI-PAK 21 TAB) 10 MG (21) TBPK tablet Take by mouth daily. Take 6 tabs by mouth daily  for 2 days, then 5 tabs for 2 days, then 4 tabs for 2 days, then 3 tabs for 2 days, 2 tabs for 2 days, then 1 tab by mouth daily for 2 days 08/25/24  Yes Maxamillian Tienda N, PA-C  diclofenac  Sodium (VOLTAREN ) 1 % GEL Apply 2 g topically 4 (four) times daily. 02/11/24   Beverley Leita LABOR, PA-C  EPINEPHrine  0.3 mg/0.3 mL IJ SOAJ injection Inject 0.3 mg into the muscle as needed for anaphylaxis. 09/19/23   Trine Raynell Moder, MD  famotidine  (PEPCID ) 20 MG tablet Take 1 tablet (20 mg total) by mouth daily for 5 days. 09/19/23 09/24/23  Trine Raynell Moder, MD  hydroxychloroquine (PLAQUENIL) 200 MG tablet Take by mouth.    [provider]  pantoprazole  (PROTONIX ) 20 MG tablet Take 1 tablet (20 mg total) by mouth daily. 08/04/24 09/03/24  Neysa Thersia RAMAN, PA-C  predniSONE  (STERAPRED UNI-PAK 21 TAB) 10 MG (21) TBPK tablet Take by mouth daily. Take 4 tabs by mouth daily  for 2 days, then 3 tabs for 2 days, then 2 tabs for 2 days, then 1 tabs for 2 days, 1/2 tablet daily to maintain control of  process 02/11/24   Beverley Leita LABOR, PA-C  sucralfate  (CARAFATE ) 1 g tablet Take 1 tablet (1 g total) by mouth 4 (four) times daily -  with meals and at bedtime for 15 days. 08/04/24 08/19/24  Neysa Thersia RAMAN, PA-C    Allergies: Bactrim  [sulfamethoxazole -trimethoprim ], Penicillins, Amoxicillin, and Bactroban [mupirocin calcium]    Review of Systems  HENT:  Positive for congestion.   Respiratory:  Positive for cough and shortness of breath.     Updated Vital Signs BP 96/61   Pulse 80   Temp 98.3 F (36.8 C) (Oral)   Resp 16   Ht 5' 3 (1.6 m)   Wt 65.8 kg   SpO2 100%   BMI 25.69 kg/m   Physical Exam Vitals and nursing note reviewed.  Constitutional:      General: She is not in acute distress.    Appearance: She is well-developed. She is not toxic-appearing.  HENT:     Head: Normocephalic and atraumatic.     Nose: Congestion present. No rhinorrhea.  Eyes:     Conjunctiva/sclera: Conjunctivae normal.  Cardiovascular:     Rate and Rhythm: Normal rate and regular rhythm.     Pulses: Normal pulses.     Heart sounds: Normal heart sounds. No murmur heard. Pulmonary:     Effort: Pulmonary effort is normal. No respiratory distress.  Breath sounds: Normal breath sounds.  Abdominal:     Palpations: Abdomen is soft.     Tenderness: There is no abdominal tenderness.  Musculoskeletal:        General: No swelling.     Cervical back: Neck supple.     Right lower leg: No edema.     Left lower leg: No edema.  Skin:    General: Skin is warm and dry.     Capillary Refill: Capillary refill takes less than 2 seconds.  Neurological:     General: No focal deficit present.     Mental Status: She is alert and oriented to person, place, and time.  Psychiatric:        Mood and Affect: Mood normal.     (all labs ordered are listed, but only abnormal results are displayed) Labs Reviewed  CBC WITH DIFFERENTIAL/PLATELET - Abnormal; Notable for the following components:      Result Value    Hemoglobin 9.8 (*)    HCT 31.8 (*)    MCH 25.1 (*)    RDW 18.8 (*)    Lymphs Abs 0.5 (*)    All other components within normal limits  BASIC METABOLIC PANEL WITH GFR - Abnormal; Notable for the following components:   Calcium 8.6 (*)    All other components within normal limits  RESP PANEL BY RT-PCR (RSV, FLU A&B, COVID)  RVPGX2    EKG: None  Radiology: CT Angio Chest PE W and/or Wo Contrast Result Date: 08/25/2024 CLINICAL DATA:  Short of breath and cough for several days, history of lupus EXAM: CT ANGIOGRAPHY CHEST WITH CONTRAST TECHNIQUE: Multidetector CT imaging of the chest was performed using the standard protocol during bolus administration of intravenous contrast. Multiplanar CT image reconstructions and MIPs were obtained to evaluate the vascular anatomy. RADIATION DOSE REDUCTION: This exam was performed according to the departmental dose-optimization program which includes automated exposure control, adjustment of the mA and/or kV according to patient size and/or use of iterative reconstruction technique. CONTRAST:  75mL OMNIPAQUE  IOHEXOL  350 MG/ML SOLN COMPARISON:  08/25/2024, 07/17/2020 FINDINGS: Cardiovascular: This is a technically adequate evaluation of the pulmonary vasculature. No filling defects or pulmonary emboli. Mild cardiomegaly with small pericardial effusion. No evidence of thoracic aortic aneurysm or dissection. Mediastinum/Nodes: There is persistent bilateral axillary lymphadenopathy, with largest index lymph node in the left axilla reference image 32/4 measuring 15 mm in short axis, previously measuring 16 mm. No mediastinal or hilar adenopathy. The thyroid, trachea, and esophagus are grossly unremarkable. Lungs/Pleura: Stable elevation of the left hemidiaphragm. Trace left pleural effusion, with minimal left lower lobe consolidation and bronchial wall thickening consistent with bronchopneumonia. Right chest is clear. No pneumothorax. Upper Abdomen: The spleen is only  partially imaged, but appears enlarged measuring 11.3 x 7.0 cm in transverse dimension. No other acute upper abdominal findings. Musculoskeletal: No acute or destructive bony abnormalities. Reconstructed images demonstrate no additional findings. Review of the MIP images confirms the above findings. IMPRESSION: 1. No evidence of pulmonary embolus. 2. Cardiomegaly with small pericardial effusion. 3. Left lower lobe bronchial wall thickening and peripheral consolidation, with trace pleural effusion, compatible with bronchopneumonia. 4. Stable bilateral axillary lymphadenopathy, which is undergone previous biopsy. 5. Splenomegaly. Electronically Signed   By: Ozell Daring M.D.   On: 08/25/2024 18:21   DG Chest 2 View Result Date: 08/25/2024 EXAM: 2 VIEW(S) XRAY OF THE CHEST 08/25/2024 03:22:00 PM COMPARISON: 07/18/2020 CLINICAL HISTORY: cough FINDINGS: LUNGS AND PLEURA: No focal pulmonary opacity. Minimal left pleural  effusion is noted. No pneumothorax. HEART AND MEDIASTINUM: No acute abnormality of the cardiac and mediastinal silhouettes. BONES AND SOFT TISSUES: No acute osseous abnormality. IMPRESSION: 1. Minimal left pleural effusion. Electronically signed by: Lynwood Seip MD 08/25/2024 03:41 PM EST RP Workstation: [id number]     Procedures   Medications Ordered in the ED  levofloxacin  (LEVAQUIN ) tablet 750 mg (has no administration in time range)  ketorolac  (TORADOL ) 15 MG/ML injection 15 mg (15 mg Intravenous Given 08/25/24 1721)  iohexol  (OMNIPAQUE ) 350 MG/ML injection 75 mL (75 mLs Intravenous Contrast Given 08/25/24 1802)                                    Medical Decision Making Amount and/or Complexity of Data Reviewed Labs: ordered. Radiology: ordered.   This patient presents to the ED for concern of Cough, SHOB, this involves an extensive number of treatment options, and is a complaint that carries with it a high risk of complications and morbidity.  The differential diagnosis  includes COVID, flu, RSV, pneumonia, PE, viral URI   Co morbidities / Chronic conditions that complicate the patient evaluation  Lupus and Lyme disease   Additional history obtained:  Additional history obtained from EMR External records from outside source obtained and reviewed including Care Everywhere   Lab Tests:  I Ordered, and personally interpreted labs.  The pertinent results include: Negative respiratory panel, anemia 9.8 which is around patient's baseline per historical values, BMP unremarkable   Imaging Studies ordered:  I ordered imaging studies including CXR  I independently visualized and interpreted imaging which showed minimal left pleural effusion I agree with the radiologist interpretation CTA PE: No evidence of PE.  Cardiomegaly with small pericardial effusion.  Left lower bronchial wall thickening and peripheral consolidation, with trace pleural effusion, compatible with bronchopneumonia.   Problem List / ED Course / Critical interventions / Medication management I ordered medication including Toradol , levofloxacin  given anaphylactic penicillin allergy and no history of cephalosporin use I have reviewed the patients home medicines and have made adjustments as needed  Test / Admission - Considered:  Considered for admission or further workup however patient's vital signs, physical exam, labs, and imaging are reassuring.  Patient symptoms likely due to pneumonia.  Patient given treatment with levofloxacin  and steroid taper outpatient.  Patient also had finding of small pericardial effusion.  Patient to be seen by cardiology for further evaluation and workup.  Patient given return precautions.  I feel patient is safe for discharge at this time.     Final diagnoses:  Pericardial effusion  Community acquired pneumonia of left lung, unspecified part of lung    ED Discharge Orders          Ordered    levofloxacin  (LEVAQUIN ) 250 MG tablet  Daily         08/25/24 1832    predniSONE  (STERAPRED UNI-PAK 21 TAB) 10 MG (21) TBPK tablet  Daily        08/25/24 1832    Ambulatory referral to Cardiology       Comments: If you have not heard from the Cardiology office within the next 72 hours please call [phone].   08/25/24 1832               Francis Ileana SAILOR, PA-C 08/25/24 1839    Ruthe Cornet, DO 08/25/24 1906

## 2024-09-06 ENCOUNTER — Encounter (HOSPITAL_COMMUNITY): Payer: Self-pay

## 2024-09-06 ENCOUNTER — Emergency Department (HOSPITAL_COMMUNITY): Payer: Self-pay

## 2024-09-06 ENCOUNTER — Other Ambulatory Visit: Payer: Self-pay

## 2024-09-06 ENCOUNTER — Emergency Department (HOSPITAL_COMMUNITY)
Admission: EM | Admit: 2024-09-06 | Discharge: 2024-09-07 | Disposition: A | Discharge status: Home / Self Care | Payer: Self-pay | Attending: Emergency Medicine | Admitting: Emergency Medicine

## 2024-09-06 DIAGNOSIS — D72829 Elevated white blood cell count, unspecified: Secondary | ICD-10-CM | POA: Insufficient documentation

## 2024-09-06 DIAGNOSIS — R079 Chest pain, unspecified: Secondary | ICD-10-CM | POA: Insufficient documentation

## 2024-09-06 DIAGNOSIS — M546 Pain in thoracic spine: Secondary | ICD-10-CM | POA: Insufficient documentation

## 2024-09-06 LAB — CBC
HCT: 35.6 % — ABNORMAL LOW (ref 36.0–46.0)
Hemoglobin: 11.1 g/dL — ABNORMAL LOW (ref 12.0–15.0)
MCH: 26 pg (ref 26.0–34.0)
MCHC: 31.2 g/dL (ref 30.0–36.0)
MCV: 83.4 fL (ref 80.0–100.0)
Platelets: 230 K/uL (ref 150–400)
RBC: 4.27 MIL/uL (ref 3.87–5.11)
RDW: 20.6 % — ABNORMAL HIGH (ref 11.5–15.5)
WBC: 11.2 K/uL — ABNORMAL HIGH (ref 4.0–10.5)
nRBC: 0 % (ref 0.0–0.2)

## 2024-09-06 LAB — BASIC METABOLIC PANEL WITH GFR
Anion gap: 9 (ref 5–15)
BUN: 13 mg/dL (ref 6–20)
CO2: 28 mmol/L (ref 22–32)
Calcium: 8.7 mg/dL — ABNORMAL LOW (ref 8.9–10.3)
Chloride: 100 mmol/L (ref 98–111)
Creatinine, Ser: 0.56 mg/dL (ref 0.44–1.00)
GFR, Estimated: >60 mL/min
Glucose, Bld: 90 mg/dL (ref 70–99)
Potassium: 3.7 mmol/L (ref 3.5–5.1)
Sodium: 136 mmol/L (ref 135–145)

## 2024-09-06 LAB — TROPONIN T, HIGH SENSITIVITY
Troponin T High Sensitivity: 23 ng/L — ABNORMAL HIGH (ref 0–19)
Troponin T High Sensitivity: <15 ng/L (ref 0–19)

## 2024-09-06 NOTE — ED Provider Notes (Signed)
 " [facility] Provider Note   CSN: [id number] Arrival date & time: 09/06/24  1552     Patient presents with: Chest Pain   Katie Middleton is a 38 y.o. female.  {Add pertinent medical, surgical, social history, OB history to HPI:32947}  Chest Pain Associated symptoms: back pain   Patient presents for chest pain.  Medical history includes SLE, anemia, arthritis.  She reports diagnosis of pneumonia 1 week ago.  He has completed a 4-day course of Levaquin  and steroids.  Despite these therapies, she has had ongoing left-sided chest pain that radiates to the thoracic back.  Pain is worsened with activities and certain movements.  When she bends forward, this does worsen the pain.  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  She had worsening of symptoms today while at work.  For this reason, she presents to the ED.      Prior to Admission medications  Medication Sig Start Date End Date Taking? Authorizing Provider  diclofenac  Sodium (VOLTAREN ) 1 % GEL Apply 2 g topically 4 (four) times daily. 02/11/24   Beverley Leita LABOR, PA-C  EPINEPHrine  0.3 mg/0.3 mL IJ SOAJ injection Inject 0.3 mg into the muscle as needed for anaphylaxis. 09/19/23   Trine Raynell Moder, MD  famotidine  (PEPCID ) 20 MG tablet Take 1 tablet (20 mg total) by mouth daily for 5 days. 09/19/23 09/24/23  Trine Raynell Moder, MD  hydroxychloroquine (PLAQUENIL) 200 MG tablet Take by mouth.    [provider]  pantoprazole  (PROTONIX ) 20 MG tablet Take 1 tablet (20 mg total) by mouth daily. 08/04/24 09/03/24  Neysa Thersia RAMAN, PA-C  predniSONE  (STERAPRED UNI-PAK 21 TAB) 10 MG (21) TBPK tablet Take by mouth daily. Take 4 tabs by mouth daily  for 2 days, then 3 tabs for 2 days, then 2 tabs for 2 days, then 1 tabs for 2 days, 1/2 tablet daily to maintain control of process 02/11/24   Beverley Leita LABOR, PA-C  predniSONE  (STERAPRED UNI-PAK 21 TAB) 10 MG (21) TBPK tablet Take  by mouth daily. Take 6 tabs by mouth daily  for 2 days, then 5 tabs for 2 days, then 4 tabs for 2 days, then 3 tabs for 2 days, 2 tabs for 2 days, then 1 tab by mouth daily for 2 days 08/25/24   Keith, Kayla N, PA-C  sucralfate  (CARAFATE ) 1 g tablet Take 1 tablet (1 g total) by mouth 4 (four) times daily -  with meals and at bedtime for 15 days. 08/04/24 08/19/24  Neysa Thersia RAMAN, PA-C    Allergies: Bactrim  [sulfamethoxazole -trimethoprim ], Penicillins, Amoxicillin, and Bactroban [mupirocin calcium]    Review of Systems  Cardiovascular:  Positive for chest pain.  Musculoskeletal:  Positive for back pain.  All other systems reviewed and are negative.   Updated Vital Signs BP 104/62 (BP Location: Left Arm)   Pulse (!) 104   Temp 97.7 F (36.5 C)   Resp 17   Ht 5' 3 (1.6 m)   Wt 65.8 kg   LMP 08/25/2024   SpO2 100%   BMI 25.70 kg/m   Physical Exam Vitals and nursing note reviewed.  Constitutional:      General: She is not in acute distress.    Appearance: She is well-developed. She is not ill-appearing, toxic-appearing or diaphoretic.  HENT:     Head: Normocephalic and atraumatic.  Eyes:     Conjunctiva/sclera: Conjunctivae normal.  Cardiovascular:  Rate and Rhythm: Normal rate and regular rhythm.     Heart sounds: No murmur heard. Pulmonary:     Effort: Pulmonary effort is normal. No tachypnea or respiratory distress.     Breath sounds: Normal breath sounds.  Abdominal:     Palpations: Abdomen is soft.  Musculoskeletal:        General: No swelling. Normal range of motion.     Cervical back: Normal range of motion and neck supple.  Skin:    General: Skin is warm and dry.  Neurological:     General: No focal deficit present.     Mental Status: She is alert and oriented to person, place, and time.  Psychiatric:        Mood and Affect: Mood normal.        Behavior: Behavior normal.     (all labs ordered are listed, but only abnormal results are displayed) Labs  Reviewed  BASIC METABOLIC PANEL WITH GFR - Abnormal; Notable for the following components:      Result Value   Calcium 8.7 (*)    All other components within normal limits  CBC - Abnormal; Notable for the following components:   WBC 11.2 (*)    Hemoglobin 11.1 (*)    HCT 35.6 (*)    RDW 20.6 (*)    All other components within normal limits  TROPONIN T, HIGH SENSITIVITY - Abnormal; Notable for the following components:   Troponin T High Sensitivity 23 (*)    All other components within normal limits  TROPONIN T, HIGH SENSITIVITY    EKG: None  Radiology: DG Chest 1 View Result Date: 09/06/2024 CLINICAL DATA:  Chest pain. EXAM: CHEST  1 VIEW COMPARISON:  August 25, 2024 FINDINGS: The heart size and mediastinal contours are within normal limits. Low lung volumes are noted. Mild left basilar atelectasis and/or infiltrate is seen. No pleural effusion or pneumothorax is identified. The visualized skeletal structures are unremarkable. IMPRESSION: Low lung volumes with mild left basilar atelectasis and/or infiltrate. Electronically Signed   By: Suzen Dials M.D.   On: 09/06/2024 17:41    {Document cardiac monitor, telemetry assessment procedure when appropriate:32947} Procedures   Medications Ordered in the ED - No data to display    {Click here for ABCD2, HEART and other calculators REFRESH Note before signing:1}                              Medical Decision Making Amount and/or Complexity of Data Reviewed Radiology: ordered.   This patient presents to the ED for concern of ***, this involves an extensive number of treatment options, and is a complaint that carries with it a high risk of complications and morbidity.  The differential diagnosis includes ***   Co morbidities / Chronic conditions that complicate the patient evaluation  ***   Additional history obtained:  Additional history obtained from EMR External records from outside source obtained and reviewed  including ***   Lab Tests:  I Ordered, and personally interpreted labs.  The pertinent results include:  ***   Imaging Studies ordered:  I ordered imaging studies including ***  I independently visualized and interpreted imaging which showed *** I agree with the radiologist interpretation   Cardiac Monitoring: / EKG:  The patient was maintained on a cardiac monitor.  I personally viewed and interpreted the cardiac monitored which showed an underlying rhythm of: ***   Problem List / ED Course /  Critical interventions / Medication management  Patient presenting for left-sided chest pain with radiation to thoracic back.  This has been ongoing for the past week.  This is not improved after 4-day course of Levaquin  and steroids.  She does have a history of SLE.  Prior to being bedded in the ED, workup was initiated.  Lab work shows slight leukocytosis, normal kidney function, normal electrolytes, and normal troponin x 2.  X-ray shows mild left basilar atelectasis and/or infiltrate.  She is overall well-appearing on exam.  Her breathing is unlabored.  Lungs are clear to auscultation.  She denies any pain while at rest.  CTA was ordered for further evaluation.*** I ordered medication including ***   Reevaluation of the patient after these medicines showed that the patient *** I have reviewed the patients home medicines and have made adjustments as needed   Consultations Obtained:  I requested consultation with the ***,  and discussed lab and imaging findings as well as pertinent plan - they recommend: ***   Social Determinants of Health:  ***   Test / Admission - Considered:  ***   {Document critical care time when appropriate  Document review of labs and clinical decision tools ie CHADS2VASC2, etc  Document your independent review of radiology images and any outside records  Document your discussion with family members, caretakers and with consultants  Document social  determinants of health affecting pt's care  Document your decision making why or why not admission, treatments were needed:32947:::1}   Final diagnoses:  None    ED Discharge Orders     None        "

## 2024-09-06 NOTE — ED Notes (Signed)
 Pt refused to take vitals because it required to take jacket off. Pt said I'm irritated I don't need vitals I need results

## 2024-09-06 NOTE — ED Triage Notes (Signed)
 Patient has complaints of chest pain. Chest pain is on the left side of the chest and started today. She reports the pain radiates to her upper back and left shoulder intermittently. She reports that she is shob. Denies Nasuea or vomiting.

## 2024-09-06 NOTE — ED Triage Notes (Signed)
 Recently dx with PNA completed course of levaquin .  Reports today chest pain, pain in upper back and pain with bending over.

## 2024-09-06 NOTE — ED Provider Triage Note (Signed)
 Emergency Medicine Provider Triage Evaluation Note  Gizelle Whetsel , a 38 y.o. female  was evaluated in triage.  Pt complains of chest pain does been ongoing for the last 24 hours.  Diagnosed with pneumonia about a week ago placed on levofloxacin  and steroids, completed the course and not having much improvement.  Worsening pain whenever she bends down along with pain between her shoulder blades.  No cough, no fevers underlying lupus.   Review of Systems  Positive: Sob, cp,  Negative: Fever, cough  Physical Exam  BP 104/62 (BP Location: Left Arm)   Pulse (!) 104   Temp 97.7 F (36.5 C)   Resp 17   LMP 08/25/2024   SpO2 100%  Gen:   Awake, no distress   Resp:  Normal effort  MSK:   Moves extremities without difficulty  Other:  Mild BL pitting edema.  Medical Decision Making  Medically screening exam initiated at 4:36 PM.  Appropriate orders placed.  Serenna Deroy was informed that the remainder of the evaluation will be completed by another provider, this initial triage assessment does not replace that evaluation, and the importance of remaining in the ED until their evaluation is complete.     Emiliya Chretien, PA-C 09/06/24 [phone]

## 2024-09-06 NOTE — ED Notes (Signed)
 PT refused vitals check.SABRASABRA

## 2024-09-07 ENCOUNTER — Emergency Department (HOSPITAL_COMMUNITY): Payer: Self-pay

## 2024-09-07 MED ORDER — IOHEXOL 350 MG/ML SOLN
75.0000 mL | Freq: Once | INTRAVENOUS | Status: AC | PRN
  Administered 2024-09-07: 75 mL via INTRAVENOUS

## 2024-09-07 MED ORDER — PREDNISONE 10 MG (21) PO TBPK: ORAL_TABLET | Freq: Every day | ORAL | 0 refills | Status: AC

## 2024-09-07 NOTE — Discharge Instructions (Addendum)
 Your CT scan showed improvement in left lower lobe pneumonia when compared to previous imaging.  You have a small pericardial effusion which may suggest a pericarditis.  Treatment for postinfectious inflammation and/or pericarditis is steroids and ibuprofen.  A prescription for additional steroid taper was sent to your pharmacy.  Take as prescribed.  Take 600 mg ibuprofen every 6 hours as needed for pain.  You should also follow-up with a cardiologist.  A referral has been ordered.  If you do not hear from the cardiology office in the next few days, call the telephone number below.  Return to the emergency department for any new or worsening symptoms of concern.

## 2024-09-22 ENCOUNTER — Ambulatory Visit: Payer: Self-pay | Admitting: Cardiovascular Disease

## 2024-12-08 ENCOUNTER — Ambulatory Visit: Payer: Self-pay | Admitting: Internal Medicine
